# Patient Record
Sex: Female | Born: 1940 | Race: White | Hispanic: No | Smoking: Former smoker
Health system: Southern US, Community
[De-identification: ages and names within clinical notes are randomized; demographics above are authoritative.]

## PROBLEM LIST (undated history)

## (undated) DIAGNOSIS — K219 Gastro-esophageal reflux disease without esophagitis: Secondary | ICD-10-CM

## (undated) DIAGNOSIS — E7800 Pure hypercholesterolemia, unspecified: Secondary | ICD-10-CM

## (undated) DIAGNOSIS — J449 Chronic obstructive pulmonary disease, unspecified: Secondary | ICD-10-CM

## (undated) HISTORY — PX: CHOLECYSTECTOMY: SHX55

## (undated) HISTORY — PX: BREAST LUMPECTOMY: SHX2

## (undated) HISTORY — DX: Chronic obstructive pulmonary disease, unspecified: J44.9

## (undated) HISTORY — PX: GALLBLADDER SURGERY: SHX652

## (undated) HISTORY — DX: Pure hypercholesterolemia, unspecified: E78.00

## (undated) HISTORY — DX: Gastro-esophageal reflux disease without esophagitis: K21.9

---
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
## 1998-10-25 ENCOUNTER — Other Ambulatory Visit: Admission: RE | Admit: 1998-10-25 | Discharge: 1998-10-25 | Payer: Self-pay | Admitting: Gynecology

## 2000-03-19 ENCOUNTER — Other Ambulatory Visit: Admission: RE | Admit: 2000-03-19 | Discharge: 2000-03-19 | Payer: Self-pay | Admitting: Gynecology

## 2001-07-29 ENCOUNTER — Other Ambulatory Visit: Admission: RE | Admit: 2001-07-29 | Discharge: 2001-07-29 | Payer: Self-pay | Admitting: Gynecology

## 2003-04-27 ENCOUNTER — Other Ambulatory Visit: Admission: RE | Admit: 2003-04-27 | Discharge: 2003-04-27 | Payer: Self-pay | Admitting: Gynecology

## 2006-09-26 ENCOUNTER — Encounter: Admission: RE | Admit: 2006-09-26 | Discharge: 2006-09-26 | Payer: Self-pay | Admitting: Internal Medicine

## 2015-10-10 ENCOUNTER — Encounter (INDEPENDENT_AMBULATORY_CARE_PROVIDER_SITE_OTHER): Payer: Self-pay | Admitting: Ophthalmology

## 2015-10-24 ENCOUNTER — Encounter (INDEPENDENT_AMBULATORY_CARE_PROVIDER_SITE_OTHER): Payer: Medicare HMO | Admitting: Ophthalmology

## 2015-10-24 DIAGNOSIS — H3509 Other intraretinal microvascular abnormalities: Secondary | ICD-10-CM | POA: Diagnosis not present

## 2015-10-24 DIAGNOSIS — H35033 Hypertensive retinopathy, bilateral: Secondary | ICD-10-CM | POA: Diagnosis not present

## 2015-10-24 DIAGNOSIS — H4312 Vitreous hemorrhage, left eye: Secondary | ICD-10-CM | POA: Diagnosis not present

## 2015-10-24 DIAGNOSIS — I10 Essential (primary) hypertension: Secondary | ICD-10-CM

## 2015-10-24 DIAGNOSIS — H43813 Vitreous degeneration, bilateral: Secondary | ICD-10-CM

## 2016-02-27 ENCOUNTER — Ambulatory Visit (INDEPENDENT_AMBULATORY_CARE_PROVIDER_SITE_OTHER): Payer: Medicare HMO | Admitting: Ophthalmology

## 2016-02-27 DIAGNOSIS — H3509 Other intraretinal microvascular abnormalities: Secondary | ICD-10-CM

## 2016-02-27 DIAGNOSIS — H35033 Hypertensive retinopathy, bilateral: Secondary | ICD-10-CM | POA: Diagnosis not present

## 2016-02-27 DIAGNOSIS — I10 Essential (primary) hypertension: Secondary | ICD-10-CM | POA: Diagnosis not present

## 2016-02-27 DIAGNOSIS — H43813 Vitreous degeneration, bilateral: Secondary | ICD-10-CM

## 2016-04-28 ENCOUNTER — Encounter (INDEPENDENT_AMBULATORY_CARE_PROVIDER_SITE_OTHER): Payer: Medicare HMO | Admitting: Ophthalmology

## 2016-04-28 DIAGNOSIS — H43813 Vitreous degeneration, bilateral: Secondary | ICD-10-CM

## 2016-04-28 DIAGNOSIS — H3509 Other intraretinal microvascular abnormalities: Secondary | ICD-10-CM

## 2016-04-28 DIAGNOSIS — H35033 Hypertensive retinopathy, bilateral: Secondary | ICD-10-CM | POA: Diagnosis not present

## 2016-04-28 DIAGNOSIS — I10 Essential (primary) hypertension: Secondary | ICD-10-CM

## 2016-09-04 ENCOUNTER — Ambulatory Visit (INDEPENDENT_AMBULATORY_CARE_PROVIDER_SITE_OTHER): Payer: Medicare HMO | Admitting: Ophthalmology

## 2017-11-16 ENCOUNTER — Encounter (INDEPENDENT_AMBULATORY_CARE_PROVIDER_SITE_OTHER): Payer: Medicare HMO | Admitting: Ophthalmology

## 2017-11-16 DIAGNOSIS — H3562 Retinal hemorrhage, left eye: Secondary | ICD-10-CM

## 2017-11-16 DIAGNOSIS — H3509 Other intraretinal microvascular abnormalities: Secondary | ICD-10-CM | POA: Diagnosis not present

## 2017-11-16 DIAGNOSIS — H35022 Exudative retinopathy, left eye: Secondary | ICD-10-CM

## 2017-11-16 DIAGNOSIS — H43813 Vitreous degeneration, bilateral: Secondary | ICD-10-CM

## 2017-11-16 DIAGNOSIS — I10 Essential (primary) hypertension: Secondary | ICD-10-CM | POA: Diagnosis not present

## 2017-11-16 DIAGNOSIS — H35033 Hypertensive retinopathy, bilateral: Secondary | ICD-10-CM | POA: Diagnosis not present

## 2017-11-16 DIAGNOSIS — H26493 Other secondary cataract, bilateral: Secondary | ICD-10-CM | POA: Diagnosis not present

## 2017-11-20 ENCOUNTER — Encounter (INDEPENDENT_AMBULATORY_CARE_PROVIDER_SITE_OTHER): Payer: Medicare HMO | Admitting: Ophthalmology

## 2017-11-20 DIAGNOSIS — H26491 Other secondary cataract, right eye: Secondary | ICD-10-CM | POA: Diagnosis not present

## 2020-03-21 ENCOUNTER — Encounter (INDEPENDENT_AMBULATORY_CARE_PROVIDER_SITE_OTHER): Payer: Medicare HMO | Admitting: Ophthalmology

## 2020-03-21 ENCOUNTER — Other Ambulatory Visit: Payer: Self-pay

## 2020-03-21 DIAGNOSIS — H3509 Other intraretinal microvascular abnormalities: Secondary | ICD-10-CM | POA: Diagnosis not present

## 2020-03-21 DIAGNOSIS — H3560 Retinal hemorrhage, unspecified eye: Secondary | ICD-10-CM | POA: Diagnosis not present

## 2020-03-21 DIAGNOSIS — H35022 Exudative retinopathy, left eye: Secondary | ICD-10-CM

## 2020-03-21 DIAGNOSIS — I10 Essential (primary) hypertension: Secondary | ICD-10-CM

## 2020-03-21 DIAGNOSIS — H4312 Vitreous hemorrhage, left eye: Secondary | ICD-10-CM

## 2020-03-21 DIAGNOSIS — H35033 Hypertensive retinopathy, bilateral: Secondary | ICD-10-CM

## 2020-03-23 ENCOUNTER — Other Ambulatory Visit: Payer: Self-pay

## 2020-03-23 ENCOUNTER — Encounter (INDEPENDENT_AMBULATORY_CARE_PROVIDER_SITE_OTHER): Payer: Medicare HMO | Admitting: Ophthalmology

## 2020-07-16 ENCOUNTER — Other Ambulatory Visit (HOSPITAL_COMMUNITY): Payer: Self-pay | Admitting: Emergency Medicine

## 2020-07-16 ENCOUNTER — Emergency Department (HOSPITAL_BASED_OUTPATIENT_CLINIC_OR_DEPARTMENT_OTHER)
Admission: EM | Admit: 2020-07-16 | Discharge: 2020-07-16 | Disposition: A | Discharge status: Home / Self Care | Payer: Medicare HMO | Attending: Emergency Medicine | Admitting: Emergency Medicine

## 2020-07-16 ENCOUNTER — Other Ambulatory Visit: Payer: Self-pay

## 2020-07-16 ENCOUNTER — Emergency Department (HOSPITAL_BASED_OUTPATIENT_CLINIC_OR_DEPARTMENT_OTHER): Payer: Medicare HMO

## 2020-07-16 DIAGNOSIS — R202 Paresthesia of skin: Secondary | ICD-10-CM

## 2020-07-16 DIAGNOSIS — R252 Cramp and spasm: Secondary | ICD-10-CM | POA: Diagnosis not present

## 2020-07-16 DIAGNOSIS — J449 Chronic obstructive pulmonary disease, unspecified: Secondary | ICD-10-CM | POA: Insufficient documentation

## 2020-07-16 DIAGNOSIS — R0789 Other chest pain: Secondary | ICD-10-CM | POA: Diagnosis not present

## 2020-07-16 LAB — CBC WITH DIFFERENTIAL/PLATELET
Abs Immature Granulocytes: 0.03 10*3/uL (ref 0.00–0.07)
Basophils Absolute: 0.1 10*3/uL (ref 0.0–0.1)
Basophils Relative: 1 %
Eosinophils Absolute: 0.3 10*3/uL (ref 0.0–0.5)
Eosinophils Relative: 3 %
HCT: 44.7 % (ref 36.0–46.0)
Hemoglobin: 15.2 g/dL — ABNORMAL HIGH (ref 12.0–15.0)
Immature Granulocytes: 0 %
Lymphocytes Relative: 19 %
Lymphs Abs: 2 10*3/uL (ref 0.7–4.0)
MCH: 32.9 pg (ref 26.0–34.0)
MCHC: 34 g/dL (ref 30.0–36.0)
MCV: 96.8 fL (ref 80.0–100.0)
Monocytes Absolute: 0.9 10*3/uL (ref 0.1–1.0)
Monocytes Relative: 9 %
Neutro Abs: 7 10*3/uL (ref 1.7–7.7)
Neutrophils Relative %: 68 %
Platelets: 340 10*3/uL (ref 150–400)
RBC: 4.62 MIL/uL (ref 3.87–5.11)
RDW: 12 % (ref 11.5–15.5)
WBC: 10.4 10*3/uL (ref 4.0–10.5)
nRBC: 0 % (ref 0.0–0.2)

## 2020-07-16 LAB — BASIC METABOLIC PANEL
Anion gap: 10 (ref 5–15)
BUN: 11 mg/dL (ref 8–23)
CO2: 26 mmol/L (ref 22–32)
Calcium: 9.3 mg/dL (ref 8.9–10.3)
Chloride: 99 mmol/L (ref 98–111)
Creatinine, Ser: 0.62 mg/dL (ref 0.44–1.00)
GFR, Estimated: >60 mL/min (ref >60)
Glucose, Bld: 118 mg/dL — ABNORMAL HIGH (ref 70–99)
Potassium: 3.8 mmol/L (ref 3.5–5.1)
Sodium: 135 mmol/L (ref 135–145)

## 2020-07-16 LAB — TROPONIN I (HIGH SENSITIVITY): Troponin I (High Sensitivity): 4 ng/L (ref <18)

## 2020-07-16 MED ORDER — SODIUM CHLORIDE 0.9 % IV BOLUS
500.0000 mL | Freq: Once | INTRAVENOUS | Status: AC
  Administered 2020-07-16: 500 mL via INTRAVENOUS

## 2020-07-16 MED ORDER — CYCLOBENZAPRINE HCL 5 MG PO TABS: 5.0000 mg | ORAL_TABLET | Freq: Two times a day (BID) | ORAL | 0 refills | Status: DC | PRN

## 2020-07-16 MED FILL — CYCLOBENZAPRINE HCL 5 MG TA: 5 | 8 days supply | Qty: 15 | Fill #0

## 2020-07-16 NOTE — ED Provider Notes (Signed)
[facility] EMERGENCY DEPARTMENT Provider Note   CSN: [id number] Arrival date & time: 07/16/20  1212     History Chief Complaint  Patient presents with  . Numbness    Numbness and tingling in left arm  . Shortness of Breath    Pt has COPD but reports pressure in her chest and becoming SOB when moving to different rooms in the house.  . Chest Pain    Cynthia Walton is a 80 y.o. female.  Patient arrives with multiple issues.  Most pressing issues patient is having some tingling around her left elbow.  Denies any neck pain, weakness, numbness.  She is having burning sensation to the left mid arm.  She denies any headaches.  She has been having some intermittent shortness of breath but does have COPD and fairly chronic.  Sometimes she will have some chest pressure.  No active chest pain or shortness of breath.  Mostly concerned about the tingling sensation in her left elbow area.  Denies any trauma.  The history is provided by the patient.  Illness Severity:  Mild Onset quality:  Gradual Timing:  Intermittent Progression:  Waxing and waning Chronicity:  New Relieved by:  Nothing Worsened by:  Nothing Associated symptoms: chest pain and shortness of breath   Associated symptoms: no abdominal pain, no cough, no ear pain, no fever, no headaches, no rash, no sore throat and no vomiting        No past medical history on file.  There are no problems to display for this patient.      OB History   No obstetric history on file.     No family history on file.     Home Medications Prior to Admission medications   Medication Sig Start Date End Date Taking? Authorizing Provider  cyclobenzaprine (FLEXERIL) 5 MG tablet Take 1 tablet (5 mg total) by mouth 2 (two) times daily as needed for up to 15 doses for muscle spasms. 07/16/20  Yes Kristinia Leavy, DO    Allergies    Penicillins, Procaine, and Statins  Review of Systems   Review of Systems  Constitutional:  Negative for chills and fever.  HENT: Negative for ear pain and sore throat.   Eyes: Negative for pain and visual disturbance.  Respiratory: Positive for shortness of breath. Negative for cough.   Cardiovascular: Positive for chest pain. Negative for palpitations.  Gastrointestinal: Negative for abdominal pain and vomiting.  Genitourinary: Negative for dysuria and hematuria.  Musculoskeletal: Negative for arthralgias and back pain.  Skin: Negative for color change and rash.  Neurological: Positive for numbness (tingling to left arm). Negative for dizziness, tremors, seizures, syncope, facial asymmetry, speech difficulty, weakness, light-headedness and headaches.  All other systems reviewed and are negative.   Physical Exam Updated Vital Signs  ED Triage Vitals  Enc Vitals Group     BP 07/16/20 1226 117/90     Pulse Rate 07/16/20 1226 94     Resp 07/16/20 1226 18     Temp 07/16/20 1226 97.9 F (36.6 C)     Temp Source 07/16/20 1226 Oral     SpO2 07/16/20 1226 94 %     Weight --      Height --      Head Circumference --      Peak Flow --      Pain Score 07/16/20 1228 0     Pain Loc --      Pain Edu? --  Excl. in GC? --     Physical Exam Vitals and nursing note reviewed.  Constitutional:      General: She is not in acute distress.    Appearance: She is well-developed and well-nourished. She is not ill-appearing.  HENT:     Head: Normocephalic and atraumatic.     Mouth/Throat:     Mouth: Mucous membranes are moist.  Eyes:     Conjunctiva/sclera: Conjunctivae normal.     Pupils: Pupils are equal, round, and reactive to light.  Cardiovascular:     Rate and Rhythm: Normal rate and regular rhythm.     Pulses: Normal pulses.     Heart sounds: Normal heart sounds. No murmur heard.   Pulmonary:     Effort: Pulmonary effort is normal. No respiratory distress.     Breath sounds: Normal breath sounds. No decreased breath sounds or wheezing.  Abdominal:     Palpations:  Abdomen is soft.     Tenderness: There is no abdominal tenderness.  Musculoskeletal:        General: No edema. Normal range of motion.     Cervical back: Normal range of motion and neck supple.     Right lower leg: No edema.     Left lower leg: No tenderness. No edema.  Skin:    General: Skin is warm and dry.     Capillary Refill: Capillary refill takes less than 2 seconds.  Neurological:     General: No focal deficit present.     Mental Status: She is alert and oriented to person, place, and time.     Cranial Nerves: No cranial nerve deficit.     Motor: No weakness.     Comments: 5+ out of 5 strength throughout, normal sensation, no drift, normal finger-to-nose finger, normal speech, normal gait  Psychiatric:        Mood and Affect: Mood and affect and mood normal.     ED Results / Procedures / Treatments   Labs (all labs ordered are listed, but only abnormal results are displayed) Labs Reviewed  CBC WITH DIFFERENTIAL/PLATELET - Abnormal; Notable for the following components:      Result Value   Hemoglobin 15.2 (*)    All other components within normal limits  BASIC METABOLIC PANEL - Abnormal; Notable for the following components:   Glucose, Bld 118 (*)    All other components within normal limits  TROPONIN I (HIGH SENSITIVITY)    EKG EKG Interpretation  Date/Time:  Monday July 16 2020 12:34:25 EST Ventricular Rate:  101 PR Interval:  128 QRS Duration: 78 QT Interval:  332 QTC Calculation: 430 R Axis:   82 Text Interpretation: Sinus tachycardia Otherwise normal ECG Confirmed by Virgina Norfolk [phone]) on 07/16/2020 12:47:54 PM   Radiology CT Head Wo Contrast  Result Date: 07/16/2020 CLINICAL DATA:  Migraine headache. Numbness and tingling left arm. Chest pressure. EXAM: CT HEAD WITHOUT CONTRAST TECHNIQUE: Contiguous axial images were obtained from the base of the skull through the vertex without intravenous contrast. COMPARISON:  None. FINDINGS: Brain: Mild  atrophy.  Negative for hydrocephalus. Moderate white matter changes with hypodensity throughout the cerebral white matter bilaterally extending into the internal capsule on the left. Negative for acute cortical infarct, hemorrhage, mass. Vascular: Negative for hyperdense vessel Skull: Negative Sinuses/Orbits: Paranasal sinuses clear. No orbital lesion. Bilateral cataract extraction. Other: None IMPRESSION: Mild atrophy.  Moderate white matter ischemia likely chronic. No acute abnormality. Electronically Signed   By: Marlan Palau M.D.  On: 07/16/2020 13:43   DG Chest Portable 1 View  Result Date: 07/16/2020 CLINICAL DATA:  Chest pressure for the past 3 days. EXAM: PORTABLE CHEST 1 VIEW COMPARISON:  Chest x-ray dated August 18, 2019. FINDINGS: The heart size and mediastinal contours are within normal limits. 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 No focal consolidation, pleural effusion, or pneumothorax. The visualized skeletal structures are unremarkable. IMPRESSION: 1. COPD without active disease. Electronically Signed   By: Obie Dredge M.D.   On: 07/16/2020 12:44    Procedures Procedures   Medications Ordered in ED Medications  sodium chloride 0.9 % bolus 500 mL (0 mLs Intravenous Stopped 07/16/20 1356)    ED Course  I have reviewed the triage vital signs and the nursing notes.  Pertinent labs & imaging results that were available during my care of the patient were reviewed by me and considered in my medical decision making (see chart for details).    MDM Rules/Calculators/A&P                          Cynthia Walton is a 80 year old female who presents to the ED with multiple complaints.  History of COPD.  Normal vitals.  No fever.  Mostly concerned about some left elbow burning pain for the last several days.  Has a history of bad cramps mostly in her lower legs and abdomen.  Has been having some of those issues as well to.  Has tried multiple muscle relaxants in  the past with minimal help.  Has had some chest pressure but no current chest pain.  Has some chronic shortness of breath.  But no new shortness of breath.  EKG shows sinus rhythm.  Troponin normal.  Doubt cardiac process.  Patient neurologically intact.  Normal strength and sensation specifically to bilateral upper arms.  Normal gait.  No speech changes.  No neck pain or neck tenderness.  Doubt any cervical radiculopathy.  Could be some overuse injury to the left upper arm.  However suspect likely something musculoskeletal.  No concern for stroke.  Head CT was overall unremarkable.  Chest x-ray without any signs of infection.  Electrolytes unremarkable.  No AKI or hypokalemia.  Overall recommend follow-up with primary care doctor and will refer her to neurology as may be a peripheral nerve issue.  Recommend Tylenol Motrin.  Will prescribe Flexeril.  Discharged in good condition.  Understands return precautions.  This chart was dictated using voice recognition software.  Despite best efforts to proofread,  errors can occur which can change the documentation meaning.    Final Clinical Impression(s) / ED Diagnoses Final diagnoses:  Paresthesia  Leg cramps    Rx / DC Orders ED Discharge Orders         Ordered    cyclobenzaprine (FLEXERIL) 5 MG tablet  2 times daily PRN        07/16/20 1422           Virgina Norfolk, DO 07/16/20 1426

## 2020-07-16 NOTE — ED Triage Notes (Signed)
Pt Reports numbness and tingling in her left arm and a pressure in her chest. Pt denies pain and it states it more a pressure associated with breathing than pain. Pt has some mild  nausea but no vomiting. Pt does have COPD. Pt has been experiencing symptoms for the Past 3 days.

## 2020-07-16 NOTE — ED Notes (Signed)
Numbness to left arm.  Denies pain

## 2020-09-18 ENCOUNTER — Encounter: Payer: Self-pay | Admitting: Diagnostic Neuroimaging

## 2020-09-18 ENCOUNTER — Ambulatory Visit: Payer: Medicare HMO | Admitting: Diagnostic Neuroimaging

## 2020-09-18 VITALS — BP 137/77 | HR 107 | Ht 59.0 in | Wt 123.6 lb

## 2020-09-18 DIAGNOSIS — M62838 Other muscle spasm: Secondary | ICD-10-CM

## 2020-09-18 DIAGNOSIS — M6281 Muscle weakness (generalized): Secondary | ICD-10-CM | POA: Diagnosis not present

## 2020-09-18 NOTE — Patient Instructions (Signed)
INTERMITTENT MUSCLE SPASMS / PROGRESSIVE MUSCLE WEAKNESS - check labs and EMG/NCS

## 2020-09-18 NOTE — Progress Notes (Signed)
[facility]  PATIENT: Cynthia Walton DOB: October 30, 1940  REFERRING CLINICIAN: Herma Carson, MD HISTORY FROM: patient  REASON FOR VISIT: new consult    HISTORICAL  CHIEF COMPLAINT:  Chief Complaint  Patient presents with  . Paresthesias    Rm 6 New Pt, ED referral  "still having numbness/tingling in left arm"    HISTORY OF PRESENT ILLNESS:   80 year old female here for evaluation of muscle spasms.  Patient has had diffuse aches and pains since 2016, previously diagnosed with fibromyalgia.  In 2019 she started having muscle spasms in her arms, legs, ribs and chest.  Symptoms continued over time.  In the past 1 year she has had muscle weakness in her legs, difficulty with climbing stairs.  In February 2022 patient went to the hospital for evaluation of shortness of breath, chest pain and numbness and tingling in left arm.  No specific cause are found.     REVIEW OF SYSTEMS: Full 14 system review of systems performed and negative with exception of: As per HPI.  ALLERGIES: Allergies  Allergen Reactions  . Penicillins Hives, Rash and Swelling  . Procaine Other (See Comments)    Mouth decay    . Statins Other (See Comments)    Muscle aches Muscle aches     HOME MEDICATIONS: Outpatient Medications Prior to Visit  Medication Sig Dispense Refill  . ezetimibe (ZETIA) 10 MG tablet Take 10 mg by mouth at bedtime.    . Multiple Minerals (CALCIUM-MAGNESIUM-ZINC) TABS Take by mouth.    Marland Kitchen omeprazole (PRILOSEC) 20 MG capsule Take 1 capsule by mouth daily.    Marland Kitchen STIOLTO RESPIMAT 2.5-2.5 MCG/ACT AERS Take 1 puff by mouth daily.    Marland Kitchen VITAMIN E PO Take by mouth.    . cyclobenzaprine (FLEXERIL) 5 MG tablet Take 1 tablet (5 mg total) by mouth 2 (two) times daily as needed for up to 15 doses for muscle spasms. (Patient not taking: Reported on 09/18/2020) 15 tablet 0  . cyclobenzaprine (FLEXERIL) 5 MG tablet TAKE 1 TABLET BY MOUTH 2 TIMES DAILY AS NEEDED FOR MUSCLE  SPASMS (Patient not taking: Reported on 09/18/2020) 15 tablet 0   No facility-administered medications prior to visit.    PAST MEDICAL HISTORY: Past Medical History:  Diagnosis Date  . Acid reflux   . COPD (chronic obstructive pulmonary disease) (HCC)   . Hypercholesterolemia     PAST SURGICAL HISTORY: Past Surgical History:  Procedure Laterality Date  . APPENDECTOMY  1962   ruptured ovarian cyst  . BREAST LUMPECTOMY    . CHOLECYSTECTOMY    . GALLBLADDER SURGERY     removed stones    FAMILY HISTORY: Family History  Problem Relation Age of Onset  . Heart disease Mother   . Other Father        old age    SOCIAL HISTORY: Social History   Socioeconomic History  . Marital status: Widowed    Spouse name: Not on file  . Number of children: 1  . Years of education: Not on file  . Highest education level: High school graduate  Occupational History    Comment: retired  Tobacco Use  . Smoking status: Former Games developer  . Smokeless tobacco: Never Used  . Tobacco comment: quit many years ago  Substance and Sexual Activity  . Alcohol use: Not Currently  . Drug use: Never  . Sexual activity: Not on file  Other Topics Concern  . Not on file  Social History Narrative   Lives  alone   Social Determinants of Health   Financial Resource Strain: Not on file  Food Insecurity: Not on file  Transportation Needs: Not on file  Physical Activity: Not on file  Stress: Not on file  Social Connections: Not on file  Intimate Partner Violence: Not on file     PHYSICAL EXAM  GENERAL EXAM/CONSTITUTIONAL: Vitals:  Vitals:   09/18/20 1325  BP: 137/77  Pulse: (!) 107  Weight: 123 lb 9.6 oz (56.1 kg)  Height: 4\' 11"  (1.499 m)   Body mass index is 24.96 kg/m. Wt Readings from Last 3 Encounters:  09/18/20 123 lb 9.6 oz (56.1 kg)    Patient is in no distress; well developed, nourished and groomed; neck is supple  CARDIOVASCULAR:  Examination of carotid arteries is normal; no  carotid bruits  Regular rate and rhythm, no murmurs  Examination of peripheral vascular system by observation and palpation is normal  EYES:  Ophthalmoscopic exam of optic discs and posterior segments is normal; no papilledema or hemorrhages No exam data present  MUSCULOSKELETAL:  Gait, strength, tone, movements noted in Neurologic exam below  NEUROLOGIC: MENTAL STATUS:  No flowsheet data found.  awake, alert, oriented to person, place and time  recent and remote memory intact  normal attention and concentration  language fluent, comprehension intact, naming intact  fund of knowledge appropriate  CRANIAL NERVE:   2nd - no papilledema on fundoscopic exam  2nd, 3rd, 4th, 6th - pupils equal and reactive to light, visual fields full to confrontation, extraocular muscles intact, no nystagmus  5th - facial sensation symmetric  7th - facial strength symmetric  8th - hearing intact  9th - palate elevates symmetrically, uvula midline  11th - shoulder shrug symmetric  12th - tongue protrusion midline  MOTOR:   normal bulk and tone, full strength in the BUE  BLE --> HIP FLEXION 3, KE/KF 4, DF 4+  SENSORY:   normal and symmetric to light touch, temperature, vibration  COORDINATION:   finger-nose-finger, fine finger movements normal  REFLEXES:   deep tendon reflexes 1+ and symmetric  GAIT/STATION:   narrow based gait; cautious waddling gait     DIAGNOSTIC DATA (LABS, IMAGING, TESTING) - I reviewed patient records, labs, notes, testing and imaging myself where available.  Lab Results  Component Value Date   WBC 10.4 07/16/2020   HGB 15.2 (H) 07/16/2020   HCT 44.7 07/16/2020   MCV 96.8 07/16/2020   PLT 340 07/16/2020      Component Value Date/Time   NA 135 07/16/2020 1313   K 3.8 07/16/2020 1313   CL 99 07/16/2020 1313   CO2 26 07/16/2020 1313   GLUCOSE 118 (H) 07/16/2020 1313   BUN 11 07/16/2020 1313   CREATININE 0.62 07/16/2020 1313    CALCIUM 9.3 07/16/2020 1313   GFRNONAA >60 07/16/2020 1313   No results found for: CHOL, HDL, LDLCALC, LDLDIRECT, TRIG, CHOLHDL No results found for: 07/18/2020 No results found for: VITAMINB12 No results found for: TSH  04/26/20    Ref Range & Units 4 mo ago  HEMOGLOBIN A1C <5.7 % 6.2 High        ASSESSMENT AND PLAN  80 y.o. year old female here with diffuse muscle pain, spasms, weakness progressing since 2016.  We will proceed with further work-up to rule out neuromuscular diseases, myopathies or neuropathies  Dx:  1. Muscle spasm   2. Muscle weakness      PLAN:  INTERMITTENT MUSCLE SPASMS / PROGRESSIVE MUSCLE WEAKNESS (ddx: myopathy  vs motor neuropathy) - check labs and EMG/NCS  Orders Placed This Encounter  Procedures  . CK  . Aldolase  . Vitamin B12  . TSH  . ANA,IFA RA Diag Pnl w/rflx Tit/Patn  . Angiotensin converting enzyme  . Pan-ANCA  . NCV with EMG(electromyography)   Return for for NCV/EMG.    Suanne Marker, MD 09/18/2020, 2:05 PM Certified in Neurology, Neurophysiology and Neuroimaging  [facility] [address], [address] [place], [place] [address] [phone]

## 2020-09-20 LAB — PAN-ANCA
ANCA Proteinase 3: <3.5 U/mL (ref 0.0–3.5)
Atypical pANCA: <1:20 {titer}
C-ANCA: <1:20 {titer}
Myeloperoxidase Ab: <9 U/mL (ref 0.0–9.0)
P-ANCA: <1:20 {titer}

## 2020-09-20 LAB — VITAMIN B12: Vitamin B-12: 802 pg/mL (ref 232–1245)

## 2020-09-20 LAB — TSH: TSH: 1.81 u[IU]/mL (ref 0.450–4.500)

## 2020-09-20 LAB — ANA,IFA RA DIAG PNL W/RFLX TIT/PATN
ANA Titer 1: NEGATIVE
Cyclic Citrullin Peptide Ab: 5 units (ref 0–19)
Rheumatoid fact SerPl-aCnc: <10 IU/mL (ref <14.0)

## 2020-09-20 LAB — ANGIOTENSIN CONVERTING ENZYME: Angio Convert Enzyme: 43 U/L (ref 14–82)

## 2020-09-20 LAB — ALDOLASE: Aldolase: 11.2 U/L — ABNORMAL HIGH (ref 3.3–10.3)

## 2020-09-20 LAB — CK: Total CK: 158 U/L (ref 32–182)

## 2020-09-27 ENCOUNTER — Encounter: Payer: Medicare HMO | Admitting: Diagnostic Neuroimaging

## 2020-09-27 ENCOUNTER — Ambulatory Visit (INDEPENDENT_AMBULATORY_CARE_PROVIDER_SITE_OTHER): Payer: Medicare HMO | Admitting: Diagnostic Neuroimaging

## 2020-09-27 DIAGNOSIS — M62838 Other muscle spasm: Secondary | ICD-10-CM

## 2020-09-27 DIAGNOSIS — M6281 Muscle weakness (generalized): Secondary | ICD-10-CM

## 2020-09-27 DIAGNOSIS — Z0289 Encounter for other administrative examinations: Secondary | ICD-10-CM

## 2020-09-27 NOTE — Procedures (Signed)
[facility]  NCS (NERVE CONDUCTION STUDY) WITH EMG (ELECTROMYOGRAPHY) REPORT   STUDY DATE: 09/27/20 PATIENT NAME: Cynthia Walton DOB: [birth year] MRN: [id number]  ORDERING CLINICIAN: Joycelyn Schmid, MD   TECHNOLOGIST: Durenda Age ELECTROMYOGRAPHER: Glenford Bayley. Liela Rylee, MD  CLINICAL INFORMATION: 80 year old female with upper and lower extremity weakness and pain.  FINDINGS: NERVE CONDUCTION STUDY:  Bilateral median, left ulnar, left peroneal and left tibial motor responses are normal.  Bilateral median sensory responses have slightly decreased amplitudes and prolonged peak latencies.  Left sural, left superficial peroneal sensory responses were normal.   NEEDLE ELECTROMYOGRAPHY:  Needle examination of left lower extremity is normal.   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 -No underlying evidence of large fiber neuropathy or myopathy this time.    INTERPRETING PHYSICIAN:  Suanne Marker, MD Certified in Neurology, Neurophysiology and Neuroimaging  [facility] [address] [place], [place] [address] [phone]   [facility]    Nerve / Sites Muscle Latency Ref. Amplitude Ref. Rel Amp Segments Distance Velocity Ref. Area    ms ms mV mV %  cm m/s m/s mVms  L Median - APB     Wrist APB 4.0 ?4.4 6.0 ?4.0 100 Wrist - APB 7   21.9     Upper arm APB 7.2  5.9  98.2 Upper arm - Wrist 18 56 ?49 21.4  R Median - APB     Wrist APB 4.1 ?4.4 6.0 ?4.0 100 Wrist - APB 7   20.2     Upper arm APB 7.6  5.8  96.5 Upper arm - Wrist 19 55 ?49 19.8  L Ulnar - ADM     Wrist ADM 3.1 ?3.3 10.4 ?6.0 100 Wrist - ADM 7   27.9     B.Elbow ADM 5.7  9.6  92.3 B.Elbow - Wrist 16 61 ?49 26.0     A.Elbow ADM 7.4  10.1  105 A.Elbow - B.Elbow 10 60 ?49 27.2  L Peroneal - EDB     Ankle EDB 4.3 ?6.5 2.6 ?2.0 100 Ankle - EDB 9   8.8      Fib head EDB 9.3  2.4  93.3 Fib head - Ankle 23 46 ?44 8.2     Pop fossa EDB 11.5  2.3  95.2 Pop fossa - Fib head 10 45 ?44 7.9         Pop fossa - Ankle      L Tibial - AH     Ankle AH 3.3 ?5.8 5.3 ?4.0 100 Ankle - AH 9   11.7     Pop fossa AH 10.7  4.4  83 Pop fossa - Ankle 32 43 ?41 11.1                SNC    Nerve / Sites Rec. Site Peak Lat Ref.  Amp Ref. Segments Distance    ms ms V V  cm  L Sural - Ankle (Calf)     Calf Ankle 3.4 ?4.4 8 ?6 Calf - Ankle 14  L Superficial peroneal - Ankle     Lat leg Ankle 3.6 ?4.4 6 ?6 Lat leg - Ankle 14  L Median - Orthodromic (Dig II, Mid palm)     Dig II Wrist 4.0 ?3.4 8 ?10 Dig II - Wrist 13  R Median - Orthodromic (Dig II, Mid palm)  Dig II Wrist 4.0 ?3.4 9 ?10 Dig II - Wrist 13  L Ulnar - Orthodromic, (Dig V, Mid palm)     Dig V Wrist 2.9 ?3.1 6 ?5 Dig V - Wrist 54               F  Wave    Nerve F Lat Ref.   ms ms  L Tibial - AH 48.6 ?56.0  L Ulnar - ADM 25.8 ?32.0         EMG Summary Table    Spontaneous MUAP Recruitment  Muscle IA Fib PSW Fasc Other Amp Dur. Poly Pattern  L. Vastus medialis Normal None None None _______ Normal Normal Normal Normal  L. Tibialis anterior Normal None None None _______ Normal Normal Normal Normal  L. Gastrocnemius (Medial head) Normal None None None _______ Normal Normal Normal Normal

## 2020-12-18 LAB — COLOGUARD: COLOGUARD: NEGATIVE

## 2021-12-20 IMAGING — CT CT HEAD W/O CM
3 series · 16 of 47 positions shown, 19 images · non-contrast
Comparison: None.

CLINICAL DATA: Migraine headache. Numbness and tingling left arm.
Chest pressure.

EXAM:
CT HEAD WITHOUT CONTRAST
TECHNIQUE: Contiguous axial images were obtained from the base of the skull
through the vertex without intravenous contrast.

[Series 2: head wo · axial · 0.40mm/px · z∈[-162,-37]mm · 10 of 30 slices shown, 13 images]
[im 3/30  brain]
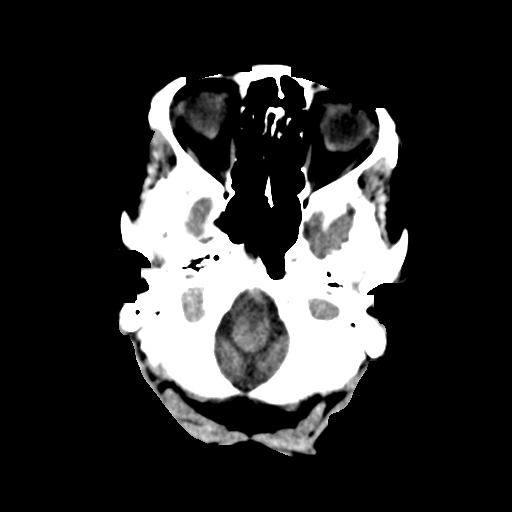
[im 3/30  bone]
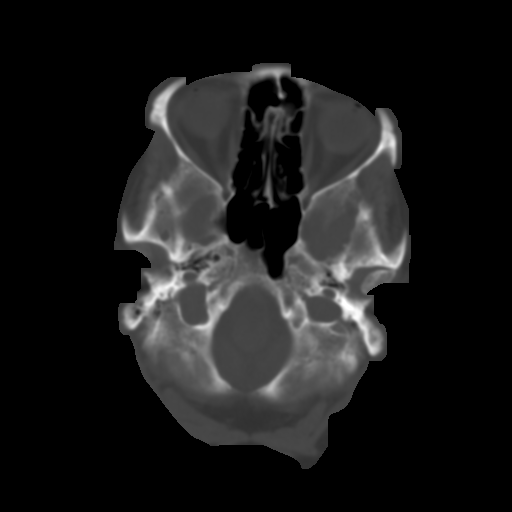
[im 6/30  brain]
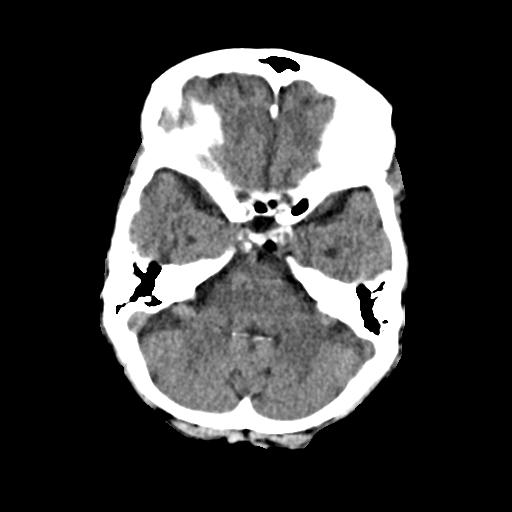
[im 9/30  brain]
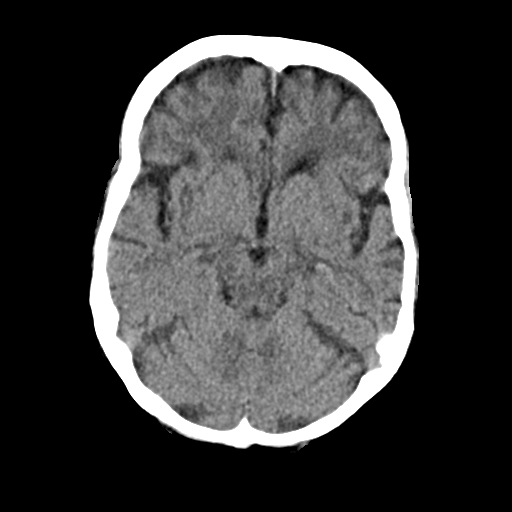
[im 11/30  brain]
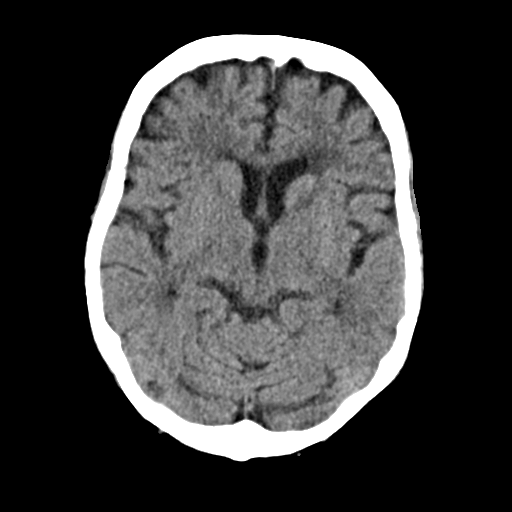
[im 14/30  brain]
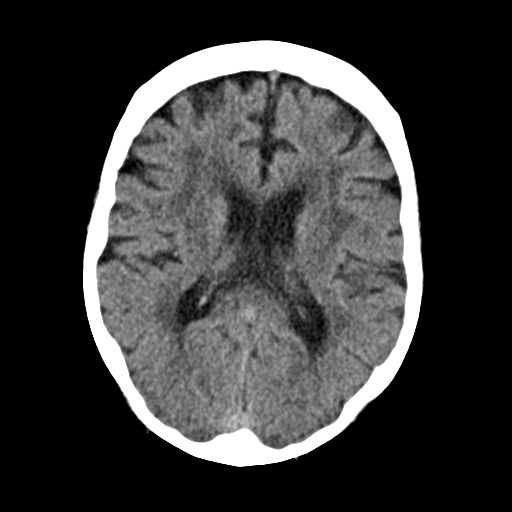
[im 14/30  bone]
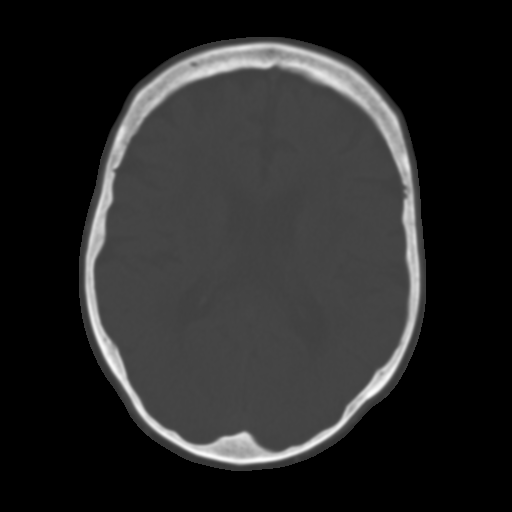
[im 17/30  brain]
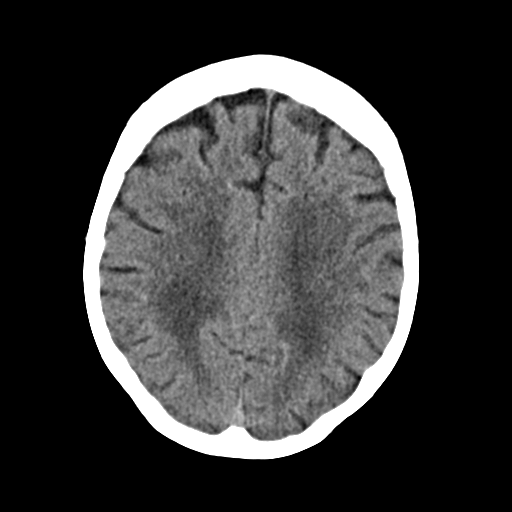
[im 20/30  brain]
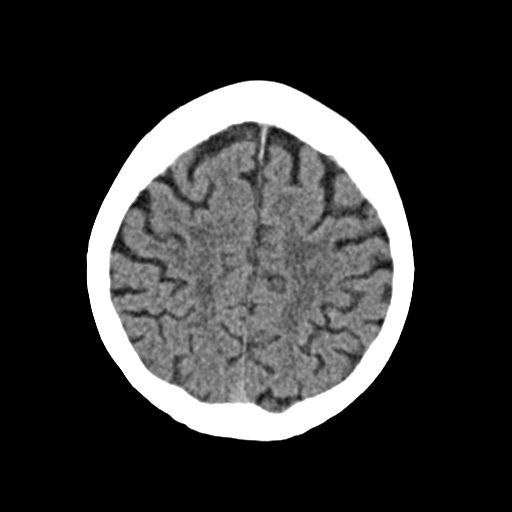
[im 23/30  brain]
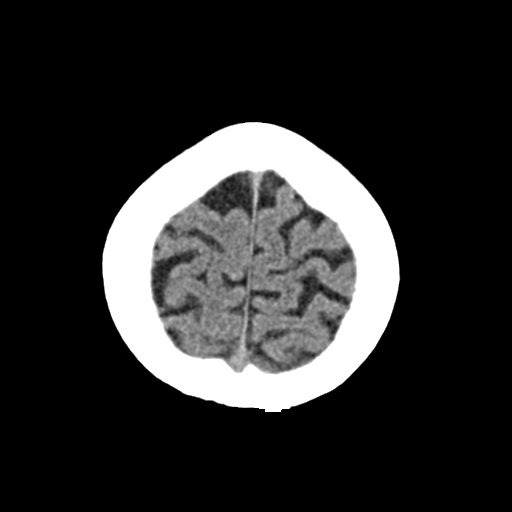
[im 25/30  brain]
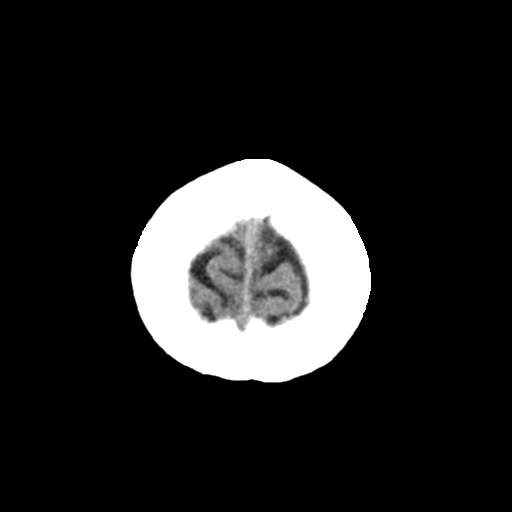
[im 25/30  bone]
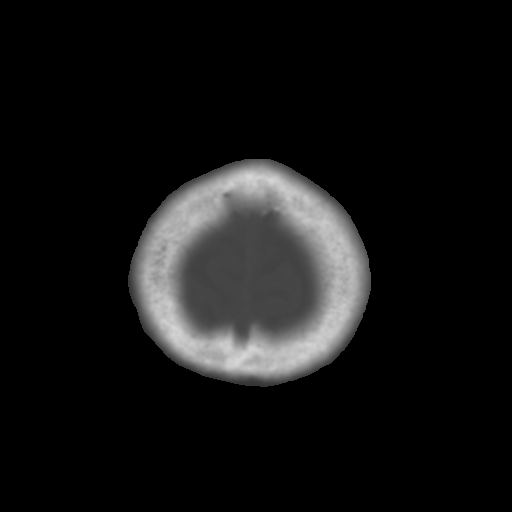
[im 28/30  brain]
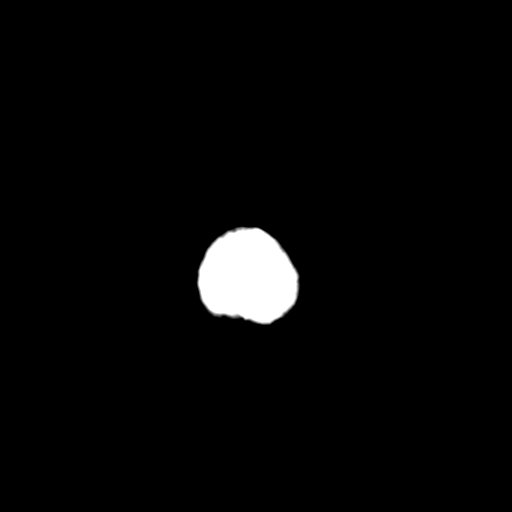

[Series 4: coronal soft · coronal · 0.30mm/px · 3 of 60 slices shown]
[im 20/60  brain]
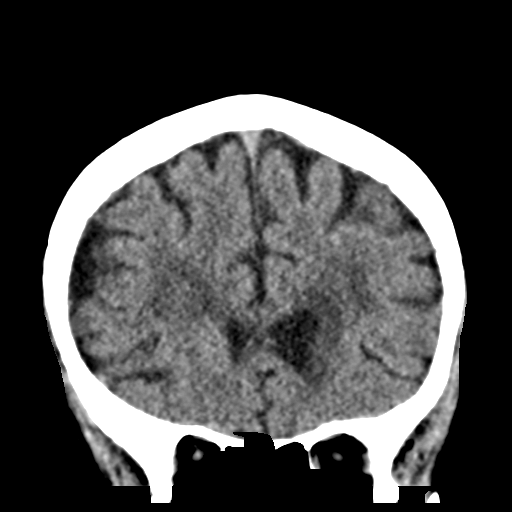
[im 27/60  brain]
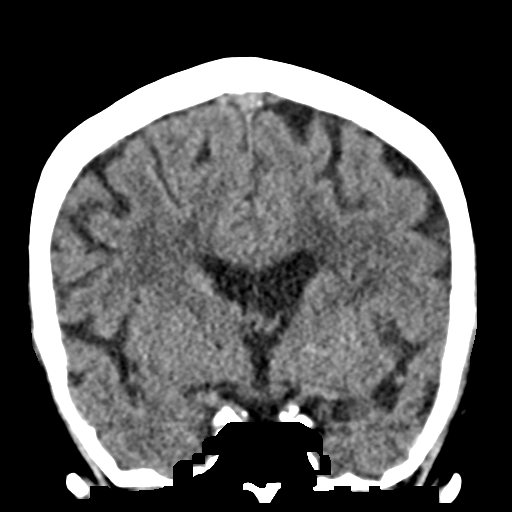
[im 33/60  brain]
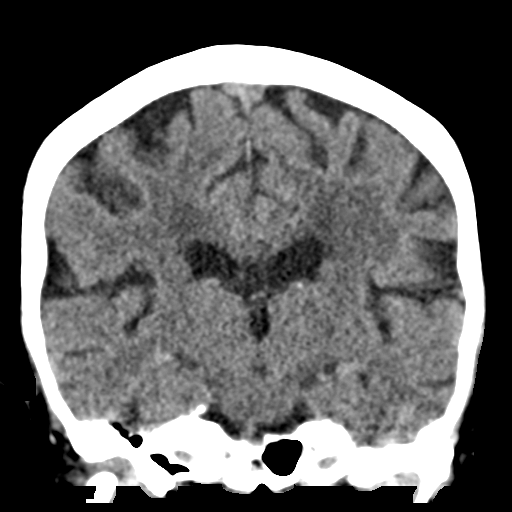

[Series 5: sag soft · sagittal · 0.30mm/px · 3 of 51 slices shown]
[im 17/51  brain]
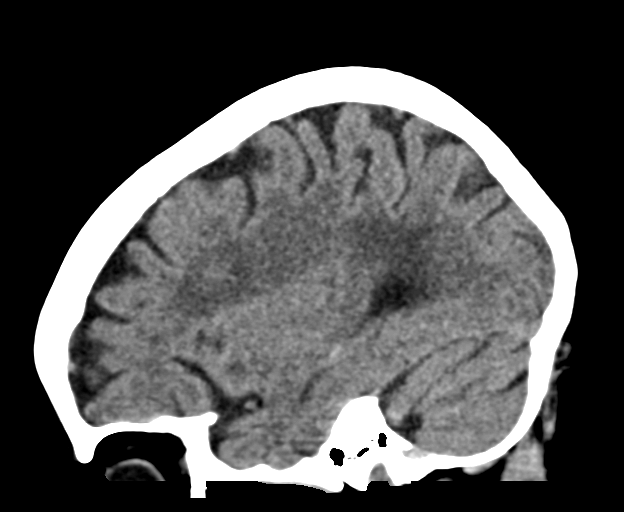
[im 26/51  brain]
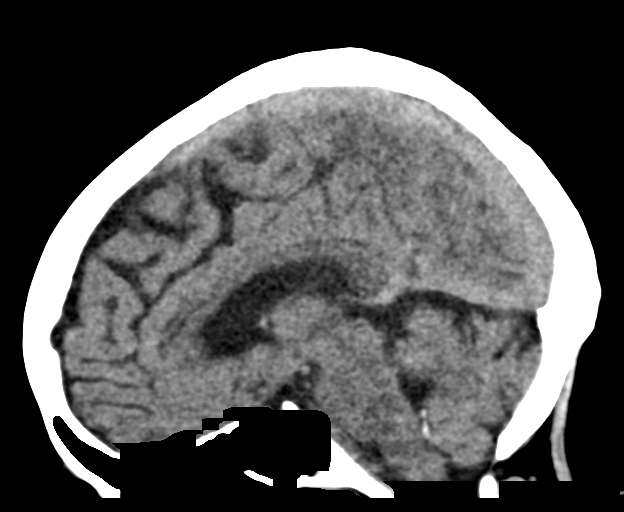
[im 34/51  brain]
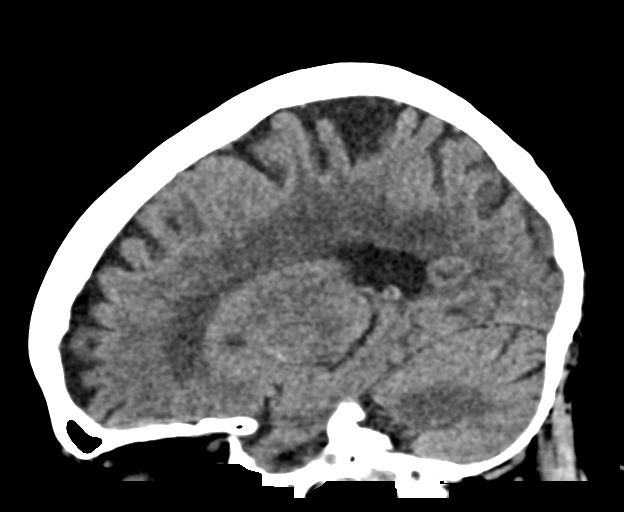

[16 of 47 positions shown; findings below may reference images not displayed]

FINDINGS: Brain: Mild atrophy.  Negative for hydrocephalus.

Moderate white matter changes with hypodensity throughout the
cerebral white matter bilaterally extending into the internal
capsule on the left.

Negative for acute cortical infarct, hemorrhage, mass.

Vascular: Negative for hyperdense vessel

Skull: Negative

Sinuses/Orbits: Paranasal sinuses clear. No orbital lesion.
Bilateral cataract extraction.

Other: None
IMPRESSION: Mild atrophy.  Moderate white matter ischemia likely chronic.

No acute abnormality.

## 2024-06-06 ENCOUNTER — Other Ambulatory Visit: Payer: Self-pay

## 2024-06-06 ENCOUNTER — Encounter (HOSPITAL_BASED_OUTPATIENT_CLINIC_OR_DEPARTMENT_OTHER): Payer: Self-pay

## 2024-06-06 ENCOUNTER — Emergency Department (HOSPITAL_BASED_OUTPATIENT_CLINIC_OR_DEPARTMENT_OTHER)

## 2024-06-06 ENCOUNTER — Inpatient Hospital Stay (HOSPITAL_BASED_OUTPATIENT_CLINIC_OR_DEPARTMENT_OTHER)
Admission: EM | Admit: 2024-06-06 | Discharge: 2024-06-10 | DRG: 180 | Disposition: A | Discharge status: Home / Self Care | Attending: Internal Medicine | Admitting: Internal Medicine

## 2024-06-06 DIAGNOSIS — Z538 Procedure and treatment not carried out for other reasons: Secondary | ICD-10-CM | POA: Diagnosis not present

## 2024-06-06 DIAGNOSIS — Z79899 Other long term (current) drug therapy: Secondary | ICD-10-CM

## 2024-06-06 DIAGNOSIS — Z681 Body mass index (BMI) 19 or less, adult: Secondary | ICD-10-CM

## 2024-06-06 DIAGNOSIS — J9601 Acute respiratory failure with hypoxia: Principal | ICD-10-CM | POA: Diagnosis present

## 2024-06-06 DIAGNOSIS — Z888 Allergy status to other drugs, medicaments and biological substances status: Secondary | ICD-10-CM

## 2024-06-06 DIAGNOSIS — R918 Other nonspecific abnormal finding of lung field: Principal | ICD-10-CM | POA: Diagnosis present

## 2024-06-06 DIAGNOSIS — C799 Secondary malignant neoplasm of unspecified site: Secondary | ICD-10-CM | POA: Diagnosis present

## 2024-06-06 DIAGNOSIS — J441 Chronic obstructive pulmonary disease with (acute) exacerbation: Secondary | ICD-10-CM | POA: Diagnosis present

## 2024-06-06 DIAGNOSIS — R9431 Abnormal electrocardiogram [ECG] [EKG]: Secondary | ICD-10-CM | POA: Diagnosis present

## 2024-06-06 DIAGNOSIS — K219 Gastro-esophageal reflux disease without esophagitis: Secondary | ICD-10-CM | POA: Diagnosis present

## 2024-06-06 DIAGNOSIS — I700 Atherosclerosis of aorta: Secondary | ICD-10-CM | POA: Diagnosis present

## 2024-06-06 DIAGNOSIS — R079 Chest pain, unspecified: Secondary | ICD-10-CM | POA: Diagnosis present

## 2024-06-06 DIAGNOSIS — Z66 Do not resuscitate: Secondary | ICD-10-CM | POA: Diagnosis present

## 2024-06-06 DIAGNOSIS — Z7952 Long term (current) use of systemic steroids: Secondary | ICD-10-CM

## 2024-06-06 DIAGNOSIS — R590 Localized enlarged lymph nodes: Secondary | ICD-10-CM | POA: Diagnosis present

## 2024-06-06 DIAGNOSIS — Z8249 Family history of ischemic heart disease and other diseases of the circulatory system: Secondary | ICD-10-CM

## 2024-06-06 DIAGNOSIS — Z7951 Long term (current) use of inhaled steroids: Secondary | ICD-10-CM

## 2024-06-06 DIAGNOSIS — I452 Bifascicular block: Secondary | ICD-10-CM | POA: Diagnosis present

## 2024-06-06 DIAGNOSIS — Z880 Allergy status to penicillin: Secondary | ICD-10-CM

## 2024-06-06 DIAGNOSIS — K7689 Other specified diseases of liver: Secondary | ICD-10-CM | POA: Diagnosis present

## 2024-06-06 DIAGNOSIS — J439 Emphysema, unspecified: Secondary | ICD-10-CM | POA: Diagnosis present

## 2024-06-06 DIAGNOSIS — G4700 Insomnia, unspecified: Secondary | ICD-10-CM | POA: Diagnosis present

## 2024-06-06 DIAGNOSIS — R634 Abnormal weight loss: Secondary | ICD-10-CM | POA: Diagnosis present

## 2024-06-06 DIAGNOSIS — E871 Hypo-osmolality and hyponatremia: Secondary | ICD-10-CM | POA: Diagnosis present

## 2024-06-06 DIAGNOSIS — G3184 Mild cognitive impairment, so stated: Secondary | ICD-10-CM | POA: Diagnosis present

## 2024-06-06 DIAGNOSIS — Z1152 Encounter for screening for COVID-19: Secondary | ICD-10-CM

## 2024-06-06 DIAGNOSIS — C3411 Malignant neoplasm of upper lobe, right bronchus or lung: Principal | ICD-10-CM | POA: Diagnosis present

## 2024-06-06 DIAGNOSIS — D72829 Elevated white blood cell count, unspecified: Secondary | ICD-10-CM | POA: Diagnosis present

## 2024-06-06 DIAGNOSIS — J449 Chronic obstructive pulmonary disease, unspecified: Secondary | ICD-10-CM | POA: Diagnosis present

## 2024-06-06 DIAGNOSIS — Z87891 Personal history of nicotine dependence: Secondary | ICD-10-CM

## 2024-06-06 DIAGNOSIS — E785 Hyperlipidemia, unspecified: Secondary | ICD-10-CM | POA: Diagnosis present

## 2024-06-06 DIAGNOSIS — C7989 Secondary malignant neoplasm of other specified sites: Secondary | ICD-10-CM

## 2024-06-06 DIAGNOSIS — Z9049 Acquired absence of other specified parts of digestive tract: Secondary | ICD-10-CM

## 2024-06-06 DIAGNOSIS — E7800 Pure hypercholesterolemia, unspecified: Secondary | ICD-10-CM | POA: Diagnosis present

## 2024-06-06 LAB — CBC
HCT: 39.9 % (ref 36.0–46.0)
Hemoglobin: 13.7 g/dL (ref 12.0–15.0)
MCH: 32.2 pg (ref 26.0–34.0)
MCHC: 34.3 g/dL (ref 30.0–36.0)
MCV: 93.9 fL (ref 80.0–100.0)
Platelets: 458 K/uL — ABNORMAL HIGH (ref 150–400)
RBC: 4.25 MIL/uL (ref 3.87–5.11)
RDW: 12.3 % (ref 11.5–15.5)
WBC: 9.4 K/uL (ref 4.0–10.5)
nRBC: 0 % (ref 0.0–0.2)

## 2024-06-06 LAB — TROPONIN T, HIGH SENSITIVITY: Troponin T High Sensitivity: <15 ng/L (ref 0–19)

## 2024-06-06 LAB — BASIC METABOLIC PANEL WITH GFR
Anion gap: 12 (ref 5–15)
BUN: 9 mg/dL (ref 8–23)
CO2: 24 mmol/L (ref 22–32)
Calcium: 10.2 mg/dL (ref 8.9–10.3)
Chloride: 97 mmol/L — ABNORMAL LOW (ref 98–111)
Creatinine, Ser: 0.8 mg/dL (ref 0.44–1.00)
GFR, Estimated: >60 mL/min
Glucose, Bld: 110 mg/dL — ABNORMAL HIGH (ref 70–99)
Potassium: 4.3 mmol/L (ref 3.5–5.1)
Sodium: 132 mmol/L — ABNORMAL LOW (ref 135–145)

## 2024-06-06 MED ORDER — MELATONIN 3 MG PO TABS
3.0000 mg | ORAL_TABLET | Freq: Every day | ORAL | Status: DC
  Administered 2024-06-07 – 2024-06-09 (×4): 3 mg via ORAL
  Filled 2024-06-06 (×4): qty 1

## 2024-06-06 MED ORDER — ALBUTEROL SULFATE HFA 108 (90 BASE) MCG/ACT IN AERS: 2.0000 | INHALATION_SPRAY | RESPIRATORY_TRACT | 0 refills | Status: DC | PRN

## 2024-06-06 MED ORDER — PANTOPRAZOLE SODIUM 40 MG PO TBEC
40.0000 mg | DELAYED_RELEASE_TABLET | Freq: Every day | ORAL | Status: DC
  Administered 2024-06-07 – 2024-06-10 (×4): 40 mg via ORAL
  Filled 2024-06-06 (×4): qty 1

## 2024-06-06 MED ORDER — LORAZEPAM 1 MG PO TABS: 0.5000 mg | ORAL_TABLET | ORAL | Status: DC | PRN

## 2024-06-06 MED ORDER — ALBUTEROL SULFATE HFA 108 (90 BASE) MCG/ACT IN AERS: 1.0000 | INHALATION_SPRAY | Freq: Four times a day (QID) | RESPIRATORY_TRACT | Status: DC | PRN

## 2024-06-06 MED ORDER — PREDNISONE 50 MG PO TABS: ORAL_TABLET | ORAL | 0 refills | Status: DC

## 2024-06-06 MED ORDER — MORPHINE SULFATE (PF) 2 MG/ML IV SOLN
2.0000 mg | Freq: Once | INTRAVENOUS | Status: AC
  Administered 2024-06-06: 2 mg via INTRAVENOUS
  Filled 2024-06-06: qty 1

## 2024-06-06 MED ORDER — FENTANYL CITRATE (PF) 50 MCG/ML IJ SOSY: 50.0000 ug | PREFILLED_SYRINGE | INTRAMUSCULAR | Status: DC | PRN

## 2024-06-06 MED ORDER — IOHEXOL 350 MG/ML SOLN
100.0000 mL | Freq: Once | INTRAVENOUS | Status: AC | PRN
  Administered 2024-06-06: 75 mL via INTRAVENOUS

## 2024-06-06 MED ORDER — FENOFIBRATE 160 MG PO TABS
160.0000 mg | ORAL_TABLET | Freq: Every day | ORAL | Status: DC
  Administered 2024-06-08 – 2024-06-10 (×3): 160 mg via ORAL
  Filled 2024-06-06 (×3): qty 1

## 2024-06-06 MED ORDER — MAGNESIUM OXIDE -MG SUPPLEMENT 400 (240 MG) MG PO TABS: 400.0000 mg | ORAL_TABLET | Freq: Every day | ORAL | Status: DC

## 2024-06-06 MED ORDER — ONDANSETRON HCL 4 MG/2ML IJ SOLN
4.0000 mg | Freq: Once | INTRAMUSCULAR | Status: AC
  Administered 2024-06-06: 4 mg via INTRAVENOUS
  Filled 2024-06-06: qty 2

## 2024-06-06 MED ORDER — METHYLPREDNISOLONE SODIUM SUCC 125 MG IJ SOLR
125.0000 mg | Freq: Once | INTRAMUSCULAR | Status: AC
  Administered 2024-06-06: 125 mg via INTRAVENOUS
  Filled 2024-06-06: qty 2

## 2024-06-06 MED ORDER — BUDESON-GLYCOPYRROL-FORMOTEROL 160-9-4.8 MCG/ACT IN AERO
2.0000 | INHALATION_SPRAY | Freq: Two times a day (BID) | RESPIRATORY_TRACT | Status: DC
  Administered 2024-06-08 – 2024-06-10 (×5): 2 via RESPIRATORY_TRACT
  Filled 2024-06-06: qty 5.9

## 2024-06-06 MED ORDER — IPRATROPIUM-ALBUTEROL 0.5-2.5 (3) MG/3ML IN SOLN
3.0000 mL | Freq: Once | RESPIRATORY_TRACT | Status: AC
  Administered 2024-06-06: 3 mL via RESPIRATORY_TRACT
  Filled 2024-06-06: qty 3

## 2024-06-06 NOTE — ED Provider Notes (Signed)
 " [place] [facility]ERGENCY DEPARTMENT AT [facility] Provider Note   CSN: [id number] Arrival date & time: 06/06/24  9070     Patient presents with: Chest Pain   Cynthia Walton is a 84 y.o. female.   84 year old female with past medical history of COPD and hyperlipidemia presents the emergency department today with chest pain.  The patient states she is having pain over her whole chest that radiates to her back.  States that this started 2 weeks ago and is gradually worsening.  States the pain is sharp in character.  Denies any leg pain or swelling.  Denies any hemoptysis.  States she has not really been coughing.  Reports that she is not really short of breath but that deep breathing does make the pain worse.  States that is also somewhat worse with movement.  She came to the emergency department today due to ongoing symptoms.   Chest Pain      Prior to Admission medications  Medication Sig Start Date End Date Taking? Authorizing Provider  albuterol  (VENTOLIN  HFA) 108 (90 Base) MCG/ACT inhaler Inhale 2 puffs into the lungs every 4 (four) hours as needed for wheezing or shortness of breath. 06/06/24  Yes Ula Prentice Grieves, MD  albuterol  (VENTOLIN  HFA) 108 (90 Base) MCG/ACT inhaler Inhale 1 puff into the lungs every 6 (six) hours as needed for wheezing or shortness of breath.   Yes [provider]  ALPRAZolam  (XANAX ) 0.25 MG tablet Take 0.25 mg by mouth at bedtime. Prn anxiety 07/11/16  Yes [provider]  ezetimibe  (ZETIA ) 10 MG tablet Take 10 mg by mouth at bedtime. 10/28/16  Yes [provider]  fenofibrate  160 MG tablet Take 160 mg by mouth daily. 07/03/22  Yes [provider]  magnesium  oxide (MAG-OX) 400 (240 Mg) MG tablet Take 400 mg by mouth daily. At night   Yes [provider]  Multiple Minerals (CALCIUM-MAGNESIUM -ZINC) TABS Take 1 tablet by mouth daily.   Yes [provider]  omeprazole (PRILOSEC) 20 MG capsule Take 1  capsule by mouth daily. 05/14/20  Yes [provider]  predniSONE  (DELTASONE ) 50 MG tablet Take one tablet by mouth daily 06/06/24  Yes Ula Prentice Vrba, MD  TRELEGY ELLIPTA 100-62.5-25 MCG/ACT AEPB Inhale 1 puff into the lungs daily.   Yes [provider]  VITAMIN E PO Take 1 tablet by mouth daily.   Yes [provider]    Allergies: Penicillins, Procaine, and Statins    Review of Systems  Cardiovascular:  Positive for chest pain.  All other systems reviewed and are negative.   Updated Vital Signs BP 128/63 (BP Location: Left Arm)   Pulse 78   Temp (!) 97.4 F (36.3 C) (Oral)   Resp 16   Ht 4' 11 (1.499 m)   Wt 43.1 kg   SpO2 93%   BMI 19.19 kg/m   Gen: NAD Eyes: PERRL, EOMI HEENT: no oropharyngeal swelling Neck: trachea midline Resp: clear to auscultation bilaterally Card: Tachycardic, no murmurs, rubs, or gallops Abd: nontender, nondistended Extremities: no calf tenderness, no edema Vascular: 2+ radial pulses bilaterally, 2+ DP pulses bilaterally Skin: no rashes Psyc: acting appropriately    (all labs ordered are listed, but only abnormal results are displayed) Labs Reviewed  CBC - Abnormal; Notable for the following components:      Result Value   Platelets 458 (*)    All other components within normal limits  BASIC METABOLIC PANEL WITH GFR - Abnormal; Notable  for the following components:   Sodium 132 (*)    Chloride 97 (*)    Glucose, Bld 110 (*)    All other components within normal limits  BASIC METABOLIC PANEL WITH GFR - Abnormal; Notable for the following components:   Sodium 134 (*)    Chloride 97 (*)    Glucose, Bld 101 (*)    Calcium 10.5 (*)    All other components within normal limits  CBC WITH DIFFERENTIAL/PLATELET - Abnormal; Notable for the following components:   WBC 10.7 (*)    Platelets 485 (*)    Neutro Abs 8.6 (*)    Monocytes Absolute 1.2 (*)    All other components within normal limits  OSMOLALITY, URINE -  Abnormal; Notable for the following components:   Osmolality, Ur 241 (*)    All other components within normal limits  CBC - Abnormal; Notable for the following components:   Platelets 474 (*)    All other components within normal limits  BASIC METABOLIC PANEL WITH GFR - Abnormal; Notable for the following components:   Sodium 132 (*)    Chloride 97 (*)    Glucose, Bld 144 (*)    All other components within normal limits  MRSA NEXT GEN BY PCR, NASAL  RESP PANEL BY RT-PCR (RSV, FLU A&B, COVID)  RVPGX2  TSH  SODIUM, URINE, RANDOM  PROTIME-INR  PROCALCITONIN  TROPONIN T, HIGH SENSITIVITY    EKG: EKG Interpretation Date/Time:  Monday June 06 2024 09:47:01 EST Ventricular Rate:  111 PR Interval:  135 QRS Duration:  122 QT Interval:  360 QTC Calculation: 490 R Axis:   91  Text Interpretation: Sinus tachycardia RBBB and LPFB Confirmed by Ula Barter [phone]) on 06/06/2024 9:56:14 AM  Radiology: US  Abdomen Limited Result Date: 06/09/2024 CLINICAL DATA:  [id number] Liver mass [id number]. New diagnosis of spiculated RIGHT lung mass suspicious for malignancy, and hypodense hepatic lesions. Question hepatic metastases. EXAM: ULTRASOUND ABDOMEN LIMITED COMPARISON:  CTA chest, 06/06/2024.  CT AP, 06/07/2024. FINDINGS: Focused ultrasound at the along the LEFT and RIGHT hepatic lobes correlating with hypodense masses seen on comparison CT. Demonstrated within the LEFT and RIGHT hepatic lobes are well-circumscribed anechoic and avascular hepatic fluid collections measuring up to 1.7 and 1.0 cm respectively. No solid or additional suspicious hepatic lesion is identified. IMPRESSION: LEFT and RIGHT hepatic lobe hypodensities on comparison CT are consistent with simple-appearing hepatic cysts. Liver mass biopsy was NOT performed. Thom Hall, MD [facility] Radiology Electronically Signed   By: Thom Hall M.D.   On: 06/09/2024 08:44     Procedures    Medications Ordered in the ED  fentaNYL  (SUBLIMAZE ) injection 50 mcg (has no administration in time range)  fenofibrate  tablet 160 mg (160 mg Oral Given 06/09/24 0930)  pantoprazole  (PROTONIX ) EC tablet 40 mg (40 mg Oral Given 06/09/24 0930)  budesonide -glycopyrrolate -formoterol  (BREZTRI ) 160-9-4.8 MCG/ACT inhaler 2 puff (2 puffs Inhalation Given 06/09/24 1854)  melatonin tablet 3 mg (3 mg Oral Given 06/09/24 2247)  magnesium  oxide (MAG-OX) tablet 400 mg (400 mg Oral Given 06/09/24 2247)  albuterol  (PROVENTIL ) (2.5 MG/3ML) 0.083% nebulizer solution 2.5 mg (has no administration in time range)  ezetimibe  (ZETIA ) tablet 10 mg (10 mg Oral Given 06/09/24 2247)  ALPRAZolam  (XANAX ) tablet 0.25 mg (0.25 mg Oral Given 06/09/24 2247)  sodium chloride  flush (NS) 0.9 % injection 3 mL (3 mLs Intravenous Given 06/09/24 2248)  acetaminophen  (TYLENOL ) tablet 650 mg (650 mg Oral Given 06/08/24 2135)  Or  acetaminophen  (TYLENOL ) suppository 650 mg ( Rectal See Alternative 06/08/24 2135)  guaiFENesin  (MUCINEX ) 12 hr tablet 600 mg (600 mg Oral Given 06/09/24 2247)  LORazepam  (ATIVAN ) injection 0.5 mg (has no administration in time range)  albuterol  (PROVENTIL ) (2.5 MG/3ML) 0.083% nebulizer solution 2.5 mg (2.5 mg Nebulization Given 06/09/24 1854)  ALPRAZolam  (XANAX ) tablet 0.5 mg (0.5 mg Oral Not Given 06/08/24 1151)  ondansetron  (ZOFRAN ) injection 4 mg (has no administration in time range)  HYDROcodone -acetaminophen  (NORCO/VICODIN) 5-325 MG per tablet 1 tablet (1 tablet Oral Given 06/09/24 2247)  predniSONE  (DELTASONE ) tablet 20 mg (20 mg Oral Given 06/10/24 0701)  morphine  (PF) 2 MG/ML injection 2 mg (2 mg Intravenous Given 06/06/24 1034)  ondansetron  (ZOFRAN ) injection 4 mg (4 mg Intravenous Given 06/06/24 1034)  iohexol  (OMNIPAQUE ) 350 MG/ML injection 100 mL (75 mLs Intravenous Contrast Given 06/06/24 1334)  ipratropium-albuterol  (DUONEB) 0.5-2.5 (3) MG/3ML nebulizer solution 3 mL (3 mLs Nebulization Given 06/06/24  1453)  methylPREDNISolone  sodium succinate (SOLU-MEDROL ) 125 mg/2 mL injection 125 mg (125 mg Intravenous Given 06/06/24 1457)  iohexol  (OMNIPAQUE ) 350 MG/ML injection 75 mL (75 mLs Intravenous Contrast Given 06/07/24 2011)  midazolam  PF (VERSED ) injection 0.5 mg (0.5 mg Intravenous Given 06/08/24 1133)                                    Medical Decision Making 84 year old female with past medical history of COPD and hyperlipidemia presenting to the emergency department today with chest pain.  I will further evaluate the patient here with basic labs Wels and EKG, chest x-ray, and troponin for further evaluation for ACS, pulmonary edema, pulmonary infiltrates, pneumothorax.  If her chest x-ray is unrevealing we will go forward with CT angiogram to evaluate for pulmonary embolism given the description of her symptoms.  With the duration of her symptoms of 2 weeks and reassuring neurovascular exam suspicion for aortic dissection is low at this time.  The patient morphine  Zofran  for symptoms and reevaluate for ultimate disposition.  The patient initial troponin is negative.  CT angiogram is pending at time signout.  Plan is for reassessment but will likely require admission as she is requiring oxygen.  Amount and/or Complexity of Data Reviewed Labs: ordered. Radiology: ordered.  Risk Prescription drug management. Decision regarding hospitalization.        Final diagnoses:  Nonspecific chest pain  Lung mass  Acute respiratory failure with hypoxia [facility])    ED Discharge Orders          Ordered    predniSONE  (DELTASONE ) 50 MG tablet        06/06/24 1501    albuterol  (VENTOLIN  HFA) 108 (90 Base) MCG/ACT inhaler  Every 4 hours PRN        06/06/24 1501               Ula Prentice Gilder, MD 06/10/24 0703  "

## 2024-06-06 NOTE — ED Notes (Signed)
 Pt sats in high 80's, [place] 1.5 l applied Sats 96 no resp no distress noted. CMP, Tropinin redrawn as directed

## 2024-06-06 NOTE — ED Notes (Signed)
 Patient transported to CT

## 2024-06-06 NOTE — Progress Notes (Signed)
 Brief note: -Likely transfer from [facility] health [facility]. - Discussed with Dr. Jerrol.  - Patient is an 84 year old female with history of COPD.  Patient presented today [facility] with 2-week history of shortness of breath.  O2 sat was noted to be in the 70s to 80s on room air, but responded to supplemental oxygen.  Negative troponin.  Tachycardia is noted.  CTA chest was negative for pulmonary embolism.  CTA chest revealed spiculated subpleural right upper lobe mass measuring 2.1 x 1.2 cm, concerning for bronchogenic malignancy, right hilar adenopathy and indeterminate left lobe liver hypodensity, also concerning for metastatic disease.  CTA chest revealed: 1. No evidence of pulmonary embolus. 2. Spiculated subpleural right upper lobe mass measuring 2.1 x 1.2 cm, concerning for bronchogenic malignancy. PET scan may be useful. 3. Pathologic right paratracheal and right hilar adenopathy, concerning for nodal metastases. 4. Indeterminate left lobe liver hypodensity, also concerning for metastatic disease. 5. Soft tissue mass in the left suprarenal fossa, which may be of adrenal or renal origin. Further imaging with dedicated abdominal and pelvic CT with IV and oral contrast may be useful. 6. Aortic Atherosclerosis (ICD10-I70.0) and Emphysema (ICD10-J43.9).    - Patient will be transferred to stepdown unit. -Please notify the hospitalist team when patient arrives.

## 2024-06-06 NOTE — ED Notes (Addendum)
 Taken off oxygen for trial after neb treatment.  SAT dropped to 87%. Placed back on 1L

## 2024-06-06 NOTE — ED Provider Notes (Signed)
" °  Physical Exam  BP (!) 154/61   Pulse 93   Temp 98 F (36.7 C)   Resp 20   Wt 46.3 kg   SpO2 96%   BMI 20.60 kg/m   Physical Exam  Procedures  Procedures  ED Course / MDM    Medical Decision Making Amount and/or Complexity of Data Reviewed Labs: ordered. Radiology: ordered.  Risk Prescription drug management. Decision regarding hospitalization.   9F, hx of COPD, presenting with chest pain, CT pending.  Patient had been off oxygen but subsequently desaturated to the 70s, placed back on 1 L O2 via nasal cannula. Cardiac troponin normal.   CTA PE:  IMPRESSION:  1. No evidence of pulmonary embolus.  2. Spiculated subpleural right upper lobe mass measuring 2.1 x 1.2  cm, concerning for bronchogenic malignancy. PET scan may be useful.  3. Pathologic right paratracheal and right hilar adenopathy,  concerning for nodal metastases.  4. Indeterminate left lobe liver hypodensity, also concerning for  metastatic disease.  5. Soft tissue mass in the left suprarenal fossa, which may be of  adrenal or renal origin. Further imaging with dedicated abdominal  and pelvic CT with IV and oral contrast may be useful.  6. Aortic Atherosclerosis (ICD10-I70.0) and Emphysema (ICD10-J43.9).   Patient CT imaging concerning for new onset lung cancer with nodal metastases, within the setting of new hypoxic respiratory failure, medicine consulted for admission.  Discussed the findings of concern for malignancy and metastasis based on CT imaging with the patient bedside and patient updated regarding plan for admission.  All questions answered at time of admission. Dr. Rosario consulted and accepted the patient in admission.     Jerrol Agent, MD 06/06/24 1655  "

## 2024-06-06 NOTE — ED Triage Notes (Signed)
 Pt reports chest pain thatgoes all the around to back x 2 weeks. Denies N/V/D. SOB has gotten worse. Pain is sharp and hurts worse with breathing. Denies fever and chills

## 2024-06-07 ENCOUNTER — Telehealth (HOSPITAL_BASED_OUTPATIENT_CLINIC_OR_DEPARTMENT_OTHER): Payer: Self-pay | Admitting: Pulmonary Disease

## 2024-06-07 ENCOUNTER — Inpatient Hospital Stay (HOSPITAL_COMMUNITY)

## 2024-06-07 DIAGNOSIS — R918 Other nonspecific abnormal finding of lung field: Secondary | ICD-10-CM | POA: Diagnosis present

## 2024-06-07 DIAGNOSIS — R079 Chest pain, unspecified: Secondary | ICD-10-CM | POA: Diagnosis not present

## 2024-06-07 DIAGNOSIS — E871 Hypo-osmolality and hyponatremia: Secondary | ICD-10-CM | POA: Diagnosis present

## 2024-06-07 DIAGNOSIS — R634 Abnormal weight loss: Secondary | ICD-10-CM | POA: Diagnosis present

## 2024-06-07 DIAGNOSIS — K219 Gastro-esophageal reflux disease without esophagitis: Secondary | ICD-10-CM | POA: Diagnosis present

## 2024-06-07 DIAGNOSIS — Z66 Do not resuscitate: Secondary | ICD-10-CM | POA: Diagnosis present

## 2024-06-07 DIAGNOSIS — J9691 Respiratory failure, unspecified with hypoxia: Secondary | ICD-10-CM

## 2024-06-07 DIAGNOSIS — E785 Hyperlipidemia, unspecified: Secondary | ICD-10-CM | POA: Diagnosis present

## 2024-06-07 DIAGNOSIS — J449 Chronic obstructive pulmonary disease, unspecified: Secondary | ICD-10-CM | POA: Diagnosis not present

## 2024-06-07 DIAGNOSIS — R599 Enlarged lymph nodes, unspecified: Secondary | ICD-10-CM | POA: Diagnosis not present

## 2024-06-07 DIAGNOSIS — I700 Atherosclerosis of aorta: Secondary | ICD-10-CM | POA: Diagnosis present

## 2024-06-07 DIAGNOSIS — G4700 Insomnia, unspecified: Secondary | ICD-10-CM | POA: Diagnosis present

## 2024-06-07 DIAGNOSIS — R9431 Abnormal electrocardiogram [ECG] [EKG]: Secondary | ICD-10-CM | POA: Diagnosis present

## 2024-06-07 DIAGNOSIS — D72829 Elevated white blood cell count, unspecified: Secondary | ICD-10-CM | POA: Diagnosis present

## 2024-06-07 DIAGNOSIS — C7989 Secondary malignant neoplasm of other specified sites: Secondary | ICD-10-CM

## 2024-06-07 DIAGNOSIS — G3184 Mild cognitive impairment, so stated: Secondary | ICD-10-CM | POA: Diagnosis present

## 2024-06-07 DIAGNOSIS — Z7951 Long term (current) use of inhaled steroids: Secondary | ICD-10-CM | POA: Diagnosis not present

## 2024-06-07 DIAGNOSIS — J441 Chronic obstructive pulmonary disease with (acute) exacerbation: Secondary | ICD-10-CM | POA: Diagnosis present

## 2024-06-07 DIAGNOSIS — I452 Bifascicular block: Secondary | ICD-10-CM | POA: Diagnosis present

## 2024-06-07 DIAGNOSIS — C799 Secondary malignant neoplasm of unspecified site: Secondary | ICD-10-CM | POA: Diagnosis present

## 2024-06-07 DIAGNOSIS — C3411 Malignant neoplasm of upper lobe, right bronchus or lung: Secondary | ICD-10-CM | POA: Diagnosis present

## 2024-06-07 DIAGNOSIS — Z1152 Encounter for screening for COVID-19: Secondary | ICD-10-CM | POA: Diagnosis not present

## 2024-06-07 DIAGNOSIS — Z7952 Long term (current) use of systemic steroids: Secondary | ICD-10-CM | POA: Diagnosis not present

## 2024-06-07 DIAGNOSIS — Z87891 Personal history of nicotine dependence: Secondary | ICD-10-CM | POA: Diagnosis not present

## 2024-06-07 DIAGNOSIS — Z79899 Other long term (current) drug therapy: Secondary | ICD-10-CM | POA: Diagnosis not present

## 2024-06-07 DIAGNOSIS — Z8249 Family history of ischemic heart disease and other diseases of the circulatory system: Secondary | ICD-10-CM | POA: Diagnosis not present

## 2024-06-07 DIAGNOSIS — Z9049 Acquired absence of other specified parts of digestive tract: Secondary | ICD-10-CM | POA: Diagnosis not present

## 2024-06-07 DIAGNOSIS — J9601 Acute respiratory failure with hypoxia: Secondary | ICD-10-CM | POA: Diagnosis present

## 2024-06-07 DIAGNOSIS — E7800 Pure hypercholesterolemia, unspecified: Secondary | ICD-10-CM | POA: Diagnosis present

## 2024-06-07 DIAGNOSIS — Z681 Body mass index (BMI) 19 or less, adult: Secondary | ICD-10-CM | POA: Diagnosis not present

## 2024-06-07 DIAGNOSIS — R590 Localized enlarged lymph nodes: Secondary | ICD-10-CM | POA: Diagnosis present

## 2024-06-07 DIAGNOSIS — J439 Emphysema, unspecified: Secondary | ICD-10-CM | POA: Diagnosis present

## 2024-06-07 DIAGNOSIS — J9620 Acute and chronic respiratory failure, unspecified whether with hypoxia or hypercapnia: Secondary | ICD-10-CM | POA: Diagnosis not present

## 2024-06-07 DIAGNOSIS — K7689 Other specified diseases of liver: Secondary | ICD-10-CM | POA: Diagnosis present

## 2024-06-07 LAB — CBC WITH DIFFERENTIAL/PLATELET
Abs Immature Granulocytes: 0.04 K/uL (ref 0.00–0.07)
Basophils Absolute: 0 K/uL (ref 0.0–0.1)
Basophils Relative: 0 %
Eosinophils Absolute: 0 K/uL (ref 0.0–0.5)
Eosinophils Relative: 0 %
HCT: 39.5 % (ref 36.0–46.0)
Hemoglobin: 13.5 g/dL (ref 12.0–15.0)
Immature Granulocytes: 0 %
Lymphocytes Relative: 8 %
Lymphs Abs: 0.8 K/uL (ref 0.7–4.0)
MCH: 32.1 pg (ref 26.0–34.0)
MCHC: 34.2 g/dL (ref 30.0–36.0)
MCV: 94 fL (ref 80.0–100.0)
Monocytes Absolute: 1.2 K/uL — ABNORMAL HIGH (ref 0.1–1.0)
Monocytes Relative: 11 %
Neutro Abs: 8.6 K/uL — ABNORMAL HIGH (ref 1.7–7.7)
Neutrophils Relative %: 81 %
Platelets: 485 K/uL — ABNORMAL HIGH (ref 150–400)
RBC: 4.2 MIL/uL (ref 3.87–5.11)
RDW: 12.4 % (ref 11.5–15.5)
WBC: 10.7 K/uL — ABNORMAL HIGH (ref 4.0–10.5)
nRBC: 0 % (ref 0.0–0.2)

## 2024-06-07 LAB — BASIC METABOLIC PANEL WITH GFR
Anion gap: 13 (ref 5–15)
BUN: 12 mg/dL (ref 8–23)
CO2: 24 mmol/L (ref 22–32)
Calcium: 10.5 mg/dL — ABNORMAL HIGH (ref 8.9–10.3)
Chloride: 97 mmol/L — ABNORMAL LOW (ref 98–111)
Creatinine, Ser: 0.62 mg/dL (ref 0.44–1.00)
GFR, Estimated: >60 mL/min
Glucose, Bld: 101 mg/dL — ABNORMAL HIGH (ref 70–99)
Potassium: 4.7 mmol/L (ref 3.5–5.1)
Sodium: 134 mmol/L — ABNORMAL LOW (ref 135–145)

## 2024-06-07 LAB — SODIUM, URINE, RANDOM: Sodium, Ur: <30 mmol/L

## 2024-06-07 LAB — RESP PANEL BY RT-PCR (RSV, FLU A&B, COVID)  RVPGX2
Influenza A by PCR: NEGATIVE
Influenza B by PCR: NEGATIVE
Resp Syncytial Virus by PCR: NEGATIVE
SARS Coronavirus 2 by RT PCR: NEGATIVE

## 2024-06-07 LAB — MRSA NEXT GEN BY PCR, NASAL: MRSA by PCR Next Gen: NOT DETECTED

## 2024-06-07 LAB — OSMOLALITY, URINE: Osmolality, Ur: 241 mosm/kg — ABNORMAL LOW (ref 300–900)

## 2024-06-07 LAB — TSH: TSH: 1.01 u[IU]/mL (ref 0.350–4.500)

## 2024-06-07 LAB — PROCALCITONIN: Procalcitonin: <0.1 ng/mL

## 2024-06-07 MED ORDER — ACETAMINOPHEN 325 MG PO TABS
650.0000 mg | ORAL_TABLET | Freq: Four times a day (QID) | ORAL | Status: DC | PRN
  Administered 2024-06-08: 650 mg via ORAL
  Filled 2024-06-07: qty 2

## 2024-06-07 MED ORDER — ALPRAZOLAM 0.25 MG PO TABS
0.2500 mg | ORAL_TABLET | Freq: Every day | ORAL | Status: DC
  Administered 2024-06-07 – 2024-06-09 (×3): 0.25 mg via ORAL
  Filled 2024-06-07 (×3): qty 1

## 2024-06-07 MED ORDER — SODIUM CHLORIDE 0.9% FLUSH
3.0000 mL | Freq: Two times a day (BID) | INTRAVENOUS | Status: DC
  Administered 2024-06-07 – 2024-06-10 (×6): 3 mL via INTRAVENOUS

## 2024-06-07 MED ORDER — PREDNISONE 20 MG PO TABS
40.0000 mg | ORAL_TABLET | Freq: Every day | ORAL | Status: DC
  Administered 2024-06-07 – 2024-06-09 (×3): 40 mg via ORAL
  Filled 2024-06-07 (×3): qty 2

## 2024-06-07 MED ORDER — ACETAMINOPHEN 650 MG RE SUPP: 650.0000 mg | Freq: Four times a day (QID) | RECTAL | Status: DC | PRN

## 2024-06-07 MED ORDER — MAGNESIUM OXIDE -MG SUPPLEMENT 400 (240 MG) MG PO TABS
400.0000 mg | ORAL_TABLET | Freq: Every day | ORAL | Status: DC
  Administered 2024-06-07 – 2024-06-09 (×3): 400 mg via ORAL
  Filled 2024-06-07 (×3): qty 1

## 2024-06-07 MED ORDER — ENOXAPARIN SODIUM 30 MG/0.3ML IJ SOSY: 30.0000 mg | PREFILLED_SYRINGE | INTRAMUSCULAR | Status: DC

## 2024-06-07 MED ORDER — LORAZEPAM 2 MG/ML IJ SOLN: 0.5000 mg | Freq: Once | INTRAMUSCULAR | Status: DC | PRN

## 2024-06-07 MED ORDER — EZETIMIBE 10 MG PO TABS
10.0000 mg | ORAL_TABLET | Freq: Every day | ORAL | Status: DC
  Administered 2024-06-07 – 2024-06-09 (×3): 10 mg via ORAL
  Filled 2024-06-07 (×3): qty 1

## 2024-06-07 MED ORDER — ALBUTEROL SULFATE (2.5 MG/3ML) 0.083% IN NEBU: 2.5000 mg | INHALATION_SOLUTION | Freq: Four times a day (QID) | RESPIRATORY_TRACT | Status: DC | PRN

## 2024-06-07 MED ORDER — ALBUTEROL SULFATE (2.5 MG/3ML) 0.083% IN NEBU
2.5000 mg | INHALATION_SOLUTION | Freq: Four times a day (QID) | RESPIRATORY_TRACT | Status: DC
  Administered 2024-06-08: 2.5 mg via RESPIRATORY_TRACT
  Filled 2024-06-07 (×2): qty 3

## 2024-06-07 MED ORDER — IOHEXOL 350 MG/ML SOLN
75.0000 mL | Freq: Once | INTRAVENOUS | Status: AC | PRN
  Administered 2024-06-07: 75 mL via INTRAVENOUS

## 2024-06-07 MED ORDER — GUAIFENESIN ER 600 MG PO TB12
600.0000 mg | ORAL_TABLET | Freq: Two times a day (BID) | ORAL | Status: DC
  Administered 2024-06-07 – 2024-06-10 (×6): 600 mg via ORAL
  Filled 2024-06-07 (×6): qty 1

## 2024-06-07 MED ORDER — PREDNISONE 20 MG PO TABS: 40.0000 mg | ORAL_TABLET | Freq: Every day | ORAL | Status: DC

## 2024-06-07 NOTE — H&P (Addendum)
 " History and Physical    Patient: Cynthia Walton FMW:[id number] DOB: February 20, 1941 DOA: 06/06/2024 DOS: the patient was seen and examined on 06/07/2024 PCP: Alray Loader, MD  Patient coming from: Home  Chief Complaint:  Chief Complaint  Patient presents with   Chest Pain   HPI: Cynthia Walton is a 84 y.o. female with medical history significant of COPD, hyperlipidemia, GERD, and prior tobacco abuse presented with progressively worsening shortness of breath and chest pain. She is accompanied by her sister and son.  She experiences progressively worsening shortness of breath, particularly noticeable at night. She does not use oxygen therapy. She has a significant history of smoking, having quit 20 years ago after smoking for at least 30 years. No wheezing or coughing.  She has pain primarily in the upper chest and back, with the back being the most painful area. The pain is exacerbated by straining and movements.  She reports a weight loss of approximately 12 pounds over the past few months.  In the emergency department patient was noted to be afebrile with respirations 13-28, pulse 72-130, blood pressures 98, and O2 saturations in the 80s on room air with improvement on 1.5 L of nasal cannula oxygen O2 96%.  Labs 1/12 noted WBC 9.4, platelets 458, sodium 132, chloride 97, glucose 110, and high-sensitivity troponin 15.  Chest x-ray noted emphysema without acute abnormality.  CT angiogram of the chest noted no evidence of pulmonary embolism with a spiculated subpleural right upper lobe mass measuring 2.1 x 1.2 cm concerning for bronchogenic malignancy with pathologic right paratracheal and right hilar adenopathy concerning for nodal metastasis with indeterminate left lobe liver hypodensity concerning for metastatic disease, and soft tissue mass of the left pararenal fossa which may be adrenal or renal origin.  Repeat labs today noted WBC 10.7, platelets 485, sodium 134, and calcium 10.5  Review  of Systems: As mentioned in the history of present illness. All other systems reviewed and are negative. Past Medical History:  Diagnosis Date   Acid reflux    COPD (chronic obstructive pulmonary disease) (HCC)    Hypercholesterolemia    Past Surgical History:  Procedure Laterality Date   APPENDECTOMY  1962   ruptured ovarian cyst   BREAST LUMPECTOMY     CHOLECYSTECTOMY     GALLBLADDER SURGERY     removed stones   Social History:  reports that she has quit smoking. She has never used smokeless tobacco. She reports that she does not currently use alcohol. She reports that she does not use drugs.  Allergies[1]  Family History  Problem Relation Age of Onset   Heart disease Mother    Other Father        old age    Prior to Admission medications  Medication Sig Start Date End Date Taking? Authorizing Provider  albuterol  (VENTOLIN  HFA) 108 (90 Base) MCG/ACT inhaler Inhale 2 puffs into the lungs every 4 (four) hours as needed for wheezing or shortness of breath. 06/06/24  Yes Ula Prentice Giannotti, MD  albuterol  (VENTOLIN  HFA) 108 [phone] Base) MCG/ACT inhaler Inhale into the lungs every 6 (six) hours as needed for wheezing or shortness of breath.   Yes [provider]  ALPRAZolam  (XANAX ) 0.25 MG tablet Take 0.25 mg by mouth at bedtime. Prn anxiety 07/11/16  Yes [provider]  estradiol (CLIMARA - DOSED IN MG/24 HR) 0.1 mg/24hr patch Place 0.1 mg onto the skin once a week. 2x/week   Yes [provider]  fenofibrate  160 MG  tablet Take 160 mg by mouth daily. 07/03/22  Yes [provider]  magnesium  oxide (MAG-OX) 400 (240 Mg) MG tablet Take 400 mg by mouth daily. At night   Yes [provider]  predniSONE  (DELTASONE ) 50 MG tablet Take one tablet by mouth daily 06/06/24  Yes Ula Prentice Impastato, MD  cyclobenzaprine  (FLEXERIL ) 5 MG tablet Take 1 tablet (5 mg total) by mouth 2 (two) times daily as needed for up to 15 doses for muscle spasms. Patient not taking:  Reported on 09/18/2020 07/16/20   Ruthe Cornet, DO  ezetimibe  (ZETIA ) 10 MG tablet Take 10 mg by mouth at bedtime. 10/28/16   [provider]  Multiple Minerals (CALCIUM-MAGNESIUM -ZINC) TABS Take by mouth.    [provider]  omeprazole (PRILOSEC) 20 MG capsule Take 1 capsule by mouth daily. 05/14/20   [provider]  STIOLTO RESPIMAT 2.5-2.5 MCG/ACT AERS Take 1 puff by mouth daily. 06/25/20   [provider]  TRELEGY ELLIPTA 100-62.5-25 MCG/ACT AEPB Inhale 1 puff into the lungs daily.    [provider]  VITAMIN E PO Take by mouth.    [provider]    Physical Exam: Vitals:   06/07/24 1200 06/07/24 1230 06/07/24 1300 06/07/24 1409  BP: 138/73 129/80 120/68 (!) 153/71  Pulse: 89 87 87 94  Resp:    18  Temp:    98.6 F (37 C)  TempSrc:    Oral  SpO2: 91% 93% 93% 94%  Weight:    43.1 kg  Height:    4' 11 (1.499 m)    Constitutional: Elderly female NAD, calm, comfortable Eyes: PERRL, lids and conjunctivae normal ENMT: Mucous membranes are moist. Posterior pharynx clear of any exudate or lesions.   Neck: normal, supple   Respiratory: Decreased aeration without significant wheezes or rhonchi appreciated at this time.  O2 saturation currently maintained on 1.5 L of oxygen.  Patient able to talk in fairly complete sentences Cardiovascular: Regular rate and rhythm, no murmurs / rubs / gallops. No extremity edema. . No carotid bruits.  Abdomen: no tenderness, no masses palpated.  Bowel sounds appreciated in all 4 quadrants Musculoskeletal: no clubbing / cyanosis. No joint deformity upper and lower extremities. Good ROM, no contractures. Normal muscle tone.  Skin: no rashes, lesions, ulcers. No induration Neurologic: CN 2-12 grossly intact.  Strength 5/5 in all 4.  Psychiatric: Recent memory is decreased.  Alert and oriented x 3. Normal mood.   Data Reviewed:  EKG reveals sinus tachycardia at 111 bpm with RBBB and LPFB.  Reviewed labs,  imaging, and pertinent records as documented.  Assessment and Plan:  Acute respiratory failure with hypoxia COPD Patient noted to be hypoxic with O2 saturations as low as 87% with improvement on 1.5 L of nasal cannula oxygen.  Normally not on oxygen at baseline.  Decreased aeration but no significant wheezing appreciated on physical exam.  Chest x-ray noted signs for emphysema without acute abnormality.  CT angiogram of the chest did not note any signs of a pulmonary embolism. - Admit to a telemetry bed - Check procalcitonin and respiratory virus panel - Continue Breztri  - Albuterol  nebs 4 times daily and as needed - Prednisone  40 mg p.o. daily  Lung mass  Suspected metastatic disease Acute.  Patient presents with worsening chest pain, dyspnea, unintentional weight loss of 12 pounds.  CT showing a 2.1 x 1.2 cm spiculated right upper lobe mass with pathologic right paratracheal and hilar adenopathy, indeterminate left liver hypodensity, and left  pararenal soft tissue mass.  Question possible bronchogenic carcinoma.  Risk factors include significant history of tobacco abuse.  Imaging - Check MRI of the brain with and without contrast(patient declined MRI due to being claustrophobic even with Ativan  offered and therefore CT ordered instead) and CT scan of abdomen/pelvis for further staging - N.p.o. after midnight - Appreciate IR consulted for possible liver biopsy in a.m. - Appreciate pulmonary consultative,  will follow-up for any further recommendations  Leukocytosis Acute.  WBC elevated 10.7.  No clear source of infection noted at this time. - Continue to monitor  Chest pain Abnormal EKG Acute.  Patient presents with complaints of chest pain wrapping around to her back.  high-sensitivity troponin  was negative and EKG with right bundle branch block and left posterior fascicular block.  Thought secondary to above.    - May warrant further workup after acute issues have been  addressed  Hyponatremia Acute.  Sodium was noted to be 132->134 Question possibility of SIADH. - Check urine sodium and urine osmolarity - Continue to monitor sodium level  Hypercalcemia Acute.  Calcium 10.5.  Possibly related with underlying malignancy. - Continue to monitor  Mild cognitive impairment Family makes note the patient has some issues with her memory. - Delirium precautions  Hyperlipidemia - Continue Zetia  and fenofibrate    Anxiety/insomnia - Continue Xanax  as needed.    GERD - Continue omeprazole  DVT prophylaxis: SCDs for procedure in a.m. Will need to start Lovenox  when deemed appropriate Advance Care Planning:   Code Status: Full Code    Consults: Pulmonology, interventional radiology  Family Communication: Patient's sister and son updated at bedside  Severity of Illness: The appropriate patient status for this patient is INPATIENT. Inpatient status is judged to be reasonable and necessary in order to provide the required intensity of service to ensure the patient's safety. The patient's presenting symptoms, physical exam findings, and initial radiographic and laboratory data in the context of their chronic comorbidities is felt to place them at high risk for further clinical deterioration. Furthermore, it is not anticipated that the patient will be medically stable for discharge from the hospital within 2 midnights of admission.   * I certify that at the point of admission it is my clinical judgment that the patient will require inpatient hospital care spanning beyond 2 midnights from the point of admission due to high intensity of service, high risk for further deterioration and high frequency of surveillance required.*  Author: Maximino DELENA Sharps, MD 06/07/2024 2:31 PM  For on call review [email or web address].      [1]  Allergies Allergen Reactions   Penicillins Hives, Rash and Swelling   Procaine Other (See Comments)    Mouth decay     Statins Other (See  Comments)    Muscle aches Muscle aches    "

## 2024-06-07 NOTE — Consult Note (Signed)
 "  NAME:  Cynthia Walton, MRN:  [id number], DOB:  05/13/1941, LOS: 0 ADMISSION DATE:  06/06/2024, CONSULTATION DATE:  06/07/24 REFERRING MD:  Maximino Sharps, MD CHIEF COMPLAINT:  Right lung mass   History of Present Illness:  84 year old female former smoker >30 years with severe emphysema, HLD, GERD who presented with shortness of breath and chest pain. Endorses weight loss 12 lbs in the last 3 months. Denies cough or wheezing. In the ED vitals significant for hypoxemia to 80s and started on 1-2L O2.  PCCM consulted for CTA neg for PE but significant for spiculated right upper lobe mass 2.1 x 1.2 cm with background severe emphysema. Mediastinal enlargement also seen including right paratracheal and right hilar adenopathy. Left lobe liver hypodensity also seen. IR planning for liver biopsy. PCCM consulted to discuss alternative options.    Pertinent  Medical History  As above  Significant Hospital Events: Including procedures, antibiotic start and stop dates in addition to other pertinent events     Interim History / Subjective:  As above  Objective    Blood pressure 111/84, pulse 100, temperature 98.7 F (37.1 C), temperature source Oral, resp. rate 19, height 4' 11 (1.499 m), weight 43.1 kg, SpO2 94%.       No intake or output data in the 24 hours ending 06/07/24 1746 Filed Weights   06/06/24 0950 06/07/24 1409  Weight: 46.3 kg 43.1 kg   Physical Exam: General: Chronically ill and elderly-appearing, no acute distress HENT: [place], AT, OP clear, MMM Eyes: EOMI, no scleral icterus Respiratory: Diminished to auscultation bilaterally.  No crackles, wheezing or rales Cardiovascular: RRR, -M/R/G, no JVD Extremities:-Edema,-tenderness Neuro: AAO x4, CNII-XII grossly intact Psych: Normal mood, normal affect  Imaging, labs and test in EMR in the last 24 hours reviewed independently by me. Pertinent findings below: CTA  Resolved problem list   Assessment and Plan   Spiculated  subpleural right upper lobe lung mass measuring 2.1 x 1.2 cm Right paratracheal and right hilar adenopathy Left lobe liver hypodensity concerning for mets Emphysema/COPD exacerbation Acute vs chronic hypoxemic respiratory failure  I reviewed imaging with patient and sister at bedside. Based on imaging, lung mass is highly concerning for malignancy with liver lesion suggestive of metastatic foci.   We discussed diagnostic testing including CT-guided biopsy of liver, navigational bronchoscopy and EBUS bronchoscopy. Agree that liver biopsy would be the best option as navigational bronchoscopy would carry a higher pulmonary post-op risk including pneumothorax and respiratory failure due to the location of the lesion and severe emphysema. We would only pursue bronchoscopy if unable to obtain liver biopsy or if testing nondiagnostic. Would recommend EBUS to minimize pulmonary risk. Addressed questions and concerns in detail with patient and family.  Regarding her emphysema recommend starting triple therapy. Breztri  ordered. New O2 requirement may be chronic. Reasonable to treated with prednisone  for presumed exacerbation. Will need ambulatory O2 prior to discharge to determine home O2 needs. Will arrange pulmonary follow-up in 2 weeks and if biopsy inconclusive, I will arrange bronchoscopy as an outpatient.  Pulmonary will sign off but please contact team if needed before then   Labs   CBC: Recent Labs  Lab 06/06/24 0953 06/07/24 0731  WBC 9.4 10.7*  NEUTROABS  --  8.6*  HGB 13.7 13.5  HCT 39.9 39.5  MCV 93.9 94.0  PLT 458* 485*    Basic Metabolic Panel: Recent Labs  Lab 06/06/24 1140 06/07/24 0731  NA 132* 134*  K 4.3 4.7  CL 97* 97*  CO2 24 24  GLUCOSE 110* 101*  BUN 9 12  CREATININE 0.80 0.62  CALCIUM 10.2 10.5*   GFR: Estimated Creatinine Clearance: 36.9 mL/min (by C-G formula based on SCr of 0.62 mg/dL). Recent Labs  Lab 06/06/24 0953 06/07/24 0731  WBC 9.4 10.7*     Liver Function Tests: No results for input(s): AST, ALT, ALKPHOS, BILITOT, PROT, ALBUMIN in the last 168 hours. No results for input(s): LIPASE, AMYLASE in the last 168 hours. No results for input(s): AMMONIA in the last 168 hours.  ABG No results found for: PHART, PCO2ART, PO2ART, HCO3, TCO2, ACIDBASEDEF, O2SAT   Coagulation Profile: No results for input(s): INR, PROTIME in the last 168 hours.  Cardiac Enzymes: No results for input(s): CKTOTAL, CKMB, CKMBINDEX, TROPONINI in the last 168 hours.  HbA1C: No results found for: HGBA1C  CBG: No results for input(s): GLUCAP in the last 168 hours.  Review of Systems:   As above  Past Medical History:  She,  has a past medical history of Acid reflux, COPD (chronic obstructive pulmonary disease) (HCC), and Hypercholesterolemia.   Surgical History:   Past Surgical History:  Procedure Laterality Date   APPENDECTOMY  1962   ruptured ovarian cyst   BREAST LUMPECTOMY     CHOLECYSTECTOMY     GALLBLADDER SURGERY     removed stones     Social History:   reports that she has quit smoking. She has never used smokeless tobacco. She reports that she does not currently use alcohol. She reports that she does not use drugs.   Family History:  Her family history includes Heart disease in her mother; Other in her father.   Allergies Allergies[1]   Home Medications  Prior to Admission medications  Medication Sig Start Date End Date Taking? Authorizing Provider  albuterol  (VENTOLIN  HFA) 108 (90 Base) MCG/ACT inhaler Inhale 2 puffs into the lungs every 4 (four) hours as needed for wheezing or shortness of breath. 06/06/24  Yes Ula Prentice Ullom, MD  albuterol  (VENTOLIN  HFA) 108 (90 Base) MCG/ACT inhaler Inhale 1 puff into the lungs every 6 (six) hours as needed for wheezing or shortness of breath.   Yes [provider]  ALPRAZolam  (XANAX ) 0.25 MG tablet Take 0.25 mg by mouth at bedtime.  Prn anxiety 07/11/16  Yes [provider]  ezetimibe  (ZETIA ) 10 MG tablet Take 10 mg by mouth at bedtime. 10/28/16  Yes [provider]  fenofibrate  160 MG tablet Take 160 mg by mouth daily. 07/03/22  Yes [provider]  magnesium  oxide (MAG-OX) 400 (240 Mg) MG tablet Take 400 mg by mouth daily. At night   Yes [provider]  Multiple Minerals (CALCIUM-MAGNESIUM -ZINC) TABS Take 1 tablet by mouth daily.   Yes [provider]  omeprazole (PRILOSEC) 20 MG capsule Take 1 capsule by mouth daily. 05/14/20  Yes [provider]  predniSONE  (DELTASONE ) 50 MG tablet Take one tablet by mouth daily 06/06/24  Yes Ula Prentice Beigel, MD  TRELEGY ELLIPTA 100-62.5-25 MCG/ACT AEPB Inhale 1 puff into the lungs daily.   Yes [provider]  VITAMIN E PO Take 1 tablet by mouth daily.   Yes [provider]     Critical care time: N/A    Care Time: 60 min  Slater Staff, M.D. [facility]monary/Critical Care [facility] 06/07/2024 5:46 PM   See Amion for personal pager For hours between 7 PM to 7 AM, please call Elink for urgent questions           [  1]  Allergies Allergen Reactions   Penicillins Hives, Rash and Swelling   Procaine Other (See Comments)    Mouth decay     Statins Other (See Comments)    Muscle aches Muscle aches    "

## 2024-06-08 ENCOUNTER — Inpatient Hospital Stay (HOSPITAL_COMMUNITY)

## 2024-06-08 DIAGNOSIS — R918 Other nonspecific abnormal finding of lung field: Secondary | ICD-10-CM | POA: Diagnosis not present

## 2024-06-08 DIAGNOSIS — J9620 Acute and chronic respiratory failure, unspecified whether with hypoxia or hypercapnia: Secondary | ICD-10-CM

## 2024-06-08 DIAGNOSIS — J9601 Acute respiratory failure with hypoxia: Secondary | ICD-10-CM | POA: Diagnosis not present

## 2024-06-08 DIAGNOSIS — R590 Localized enlarged lymph nodes: Secondary | ICD-10-CM | POA: Diagnosis not present

## 2024-06-08 DIAGNOSIS — J441 Chronic obstructive pulmonary disease with (acute) exacerbation: Secondary | ICD-10-CM

## 2024-06-08 LAB — BASIC METABOLIC PANEL WITH GFR
Anion gap: 12 (ref 5–15)
BUN: 11 mg/dL (ref 8–23)
CO2: 24 mmol/L (ref 22–32)
Calcium: 9.9 mg/dL (ref 8.9–10.3)
Chloride: 97 mmol/L — ABNORMAL LOW (ref 98–111)
Creatinine, Ser: 0.59 mg/dL (ref 0.44–1.00)
GFR, Estimated: >60 mL/min
Glucose, Bld: 144 mg/dL — ABNORMAL HIGH (ref 70–99)
Potassium: 4.3 mmol/L (ref 3.5–5.1)
Sodium: 132 mmol/L — ABNORMAL LOW (ref 135–145)

## 2024-06-08 LAB — PROTIME-INR
INR: 1.1 (ref 0.8–1.2)
Prothrombin Time: 14.6 s (ref 11.4–15.2)

## 2024-06-08 LAB — CBC
HCT: 37 % (ref 36.0–46.0)
Hemoglobin: 12.8 g/dL (ref 12.0–15.0)
MCH: 32.7 pg (ref 26.0–34.0)
MCHC: 34.6 g/dL (ref 30.0–36.0)
MCV: 94.4 fL (ref 80.0–100.0)
Platelets: 474 K/uL — ABNORMAL HIGH (ref 150–400)
RBC: 3.92 MIL/uL (ref 3.87–5.11)
RDW: 12.3 % (ref 11.5–15.5)
WBC: 9.5 K/uL (ref 4.0–10.5)
nRBC: 0 % (ref 0.0–0.2)

## 2024-06-08 MED ORDER — ALPRAZOLAM 0.5 MG PO TABS
0.5000 mg | ORAL_TABLET | Freq: Once | ORAL | Status: DC
  Filled 2024-06-08: qty 1

## 2024-06-08 MED ORDER — MIDAZOLAM HCL (PF) 2 MG/2ML IJ SOLN
0.5000 mg | Freq: Once | INTRAMUSCULAR | Status: AC
  Administered 2024-06-08: 0.5 mg via INTRAVENOUS

## 2024-06-08 MED ORDER — ALBUTEROL SULFATE (2.5 MG/3ML) 0.083% IN NEBU
2.5000 mg | INHALATION_SOLUTION | Freq: Two times a day (BID) | RESPIRATORY_TRACT | Status: DC
  Administered 2024-06-08 – 2024-06-10 (×4): 2.5 mg via RESPIRATORY_TRACT
  Filled 2024-06-08 (×4): qty 3

## 2024-06-08 MED ORDER — FENTANYL CITRATE (PF) 100 MCG/2ML IJ SOLN
INTRAMUSCULAR | Status: AC
  Filled 2024-06-08: qty 2

## 2024-06-08 MED ORDER — MIDAZOLAM HCL 2 MG/2ML IJ SOLN
INTRAMUSCULAR | Status: AC
  Filled 2024-06-08: qty 2

## 2024-06-08 MED ORDER — ONDANSETRON HCL 4 MG/2ML IJ SOLN: 4.0000 mg | Freq: Four times a day (QID) | INTRAMUSCULAR | Status: DC | PRN

## 2024-06-08 NOTE — Consult Note (Signed)
 "   Chief Complaint: Liver mass - IR consulted for image guided biopsy  Referring Provider(s): Claudene Maximino LABOR, MD   Supervising Physician: Hughes Simmonds  Patient Status: [facility] - In-pt  History of Present Illness: Cynthia Walton is a 84 y.o. female with pmhx of  COPD, hyperlipidemia, GERD, and prior tobacco abuse. She is currently admitted since 06/06/24 with worsening SOB, chest pain, and back pain. Also notes 12 lb weight loss in the last few months. CTA chest completed in the ED 06/06/24 shows spiculated 2.1 x 1.2 cm mass in the right upper lobe, and a 1.6 cm hypodensity in the left lobe of liver, concerning for metastatic disease. IR now consulted for image guided biopsy of the liver lesion.  Today patient with continued complaints of upper chest and back pain with movement, but not worse today than usual. No additional complaints.   Patient is Full Code  Past Medical History:  Diagnosis Date   Acid reflux    COPD (chronic obstructive pulmonary disease) (HCC)    Hypercholesterolemia     Past Surgical History:  Procedure Laterality Date   APPENDECTOMY  1962   ruptured ovarian cyst   BREAST LUMPECTOMY     CHOLECYSTECTOMY     GALLBLADDER SURGERY     removed stones    Allergies: Penicillins, Procaine, and Statins  Medications: Prior to Admission medications  Medication Sig Start Date End Date Taking? Authorizing Provider  albuterol  (VENTOLIN  HFA) 108 (90 Base) MCG/ACT inhaler Inhale 2 puffs into the lungs every 4 (four) hours as needed for wheezing or shortness of breath. 06/06/24  Yes Ula Prentice Borromeo, MD  albuterol  (VENTOLIN  HFA) 108 (90 Base) MCG/ACT inhaler Inhale 1 puff into the lungs every 6 (six) hours as needed for wheezing or shortness of breath.   Yes [provider]  ALPRAZolam  (XANAX ) 0.25 MG tablet Take 0.25 mg by mouth at bedtime. Prn anxiety 07/11/16  Yes [provider]  ezetimibe  (ZETIA ) 10 MG tablet Take 10 mg by mouth at bedtime. 10/28/16  Yes  [provider]  fenofibrate  160 MG tablet Take 160 mg by mouth daily. 07/03/22  Yes [provider]  magnesium  oxide (MAG-OX) 400 (240 Mg) MG tablet Take 400 mg by mouth daily. At night   Yes [provider]  Multiple Minerals (CALCIUM-MAGNESIUM -ZINC) TABS Take 1 tablet by mouth daily.   Yes [provider]  omeprazole (PRILOSEC) 20 MG capsule Take 1 capsule by mouth daily. 05/14/20  Yes [provider]  predniSONE  (DELTASONE ) 50 MG tablet Take one tablet by mouth daily 06/06/24  Yes Ula Prentice Kunzman, MD  TRELEGY ELLIPTA 100-62.5-25 MCG/ACT AEPB Inhale 1 puff into the lungs daily.   Yes [provider]  VITAMIN E PO Take 1 tablet by mouth daily.   Yes [provider]     Family History  Problem Relation Age of Onset   Heart disease Mother    Other Father        old age    Social History   Socioeconomic History   Marital status: Widowed    Spouse name: Not on file   Number of children: 1   Years of education: Not on file   Highest education level: High school graduate  Occupational History    Comment: retired  Tobacco Use   Smoking status: Former   Smokeless tobacco: Never   Tobacco comments:    quit many years ago  Substance and Sexual Activity   Alcohol use: Not  Currently   Drug use: Never   Sexual activity: Not on file  Other Topics Concern   Not on file  Social History Narrative   Lives alone   Social Drivers of Health   Tobacco Use: Medium Risk (06/06/2024)   Patient History    Smoking Tobacco Use: Former    Smokeless Tobacco Use: Never    Passive Exposure: Not on Actuary Strain: Not on file  Food Insecurity: No Food Insecurity (06/07/2024)   Epic    Worried About Programme Researcher, Broadcasting/film/video in the Last Year: Never true    Ran Out of Food in the Last Year: Never true  Transportation Needs: No Transportation Needs (06/07/2024)   Epic    Lack of Transportation (Medical): No    Lack of  Transportation (Non-Medical): No  Physical Activity: Not on file  Stress: Not on file  Social Connections: Not on file  Depression (EYV7-0): Not on file  Alcohol Screen: Not on file  Housing: Low Risk (06/07/2024)   Epic    Unable to Pay for Housing in the Last Year: No    Number of Times Moved in the Last Year: 0    Homeless in the Last Year: No  Utilities: Not At Risk (06/07/2024)   Epic    Threatened with loss of utilities: No  Health Literacy: Not on file     Review of Systems: A 12 point ROS discussed and pertinent positives are indicated in the HPI above.  All other systems are negative.  Vital Signs: BP (!) 151/74 (BP Location: Left Arm)   Pulse 95   Temp 97.8 F (36.6 C) (Oral)   Resp 17   Ht 4' 11 (1.499 m)   Wt 95 lb (43.1 kg)   SpO2 95%   BMI 19.19 kg/m   Advance Care Plan: No documents on file  Physical Exam Vitals and nursing note reviewed.  Constitutional:      Appearance: Normal appearance.  HENT:     Mouth/Throat:     Mouth: Mucous membranes are moist.     Pharynx: Oropharynx is clear.  Cardiovascular:     Rate and Rhythm: Normal rate.  Pulmonary:     Effort: Pulmonary effort is normal.     Breath sounds: Normal breath sounds.  Abdominal:     Palpations: Abdomen is soft.     Tenderness: There is no abdominal tenderness.  Musculoskeletal:     Right lower leg: No edema.     Left lower leg: No edema.  Skin:    General: Skin is warm and dry.  Neurological:     Mental Status: She is alert and oriented to person, place, and time. Mental status is at baseline.     Imaging: CT HEAD W & WO CONTRAST ( ) Result Date: 06/08/2024 CLINICAL DATA:  Initial evaluation for metastatic disease. EXAM: CT HEAD WITHOUT AND WITH CONTRAST TECHNIQUE: Contiguous axial images were obtained from the base of the skull through the vertex without and with intravenous contrast. RADIATION DOSE REDUCTION: This exam was performed according to the departmental  dose-optimization program which includes automated exposure control, adjustment of the mA and/or kV according to patient size and/or use of iterative reconstruction technique. CONTRAST:  75mL OMNIPAQUE  IOHEXOL  350 MG/ML SOLN COMPARISON:  CT from 07/16/2020. FINDINGS: Brain: Generalized age-related cerebral atrophy. Patchy and confluent hypodensity involving the supratentorial cerebral white matter, consistent with chronic small vessel ischemic disease, advanced in nature. 1 cm hypodensity at the anterior limb of  the left internal capsule (series 9, image 18), age indeterminate. No other acute large vessel territory infarct. No acute intracranial hemorrhage. No mass lesion, mass effect, or midline shift. No abnormal enhancement seen following contrast administration. No evidence for intracranial metastatic disease. No hydrocephalus or extra-axial fluid collection. Vascular: No abnormal hyperdense vessel seen prior to contrast administration. Calcified atherosclerosis present at skull base. Following contrast administration shin, normal intravascular enhancement seen throughout the major intracranial vascular structures. Skull: Scalp soft tissues demonstrate no acute finding. Calvarium intact. No focal osseous lesions. Sinuses/Orbits: Globes and orbital soft tissues within normal limits. Ocular senescent calcifications noted. Paranasal sinuses are largely clear. No significant mastoid effusion. Other: None. IMPRESSION: 1. No evidence for intracranial metastatic disease. 2. 1 cm hypodensity at the anterior limb of the left internal capsule, age indeterminate. While this finding could be chronic in nature, a possible small acute to subacute ischemic infarct could also potentially have this appearance. Correlation with dedicated MRI could be performed for further evaluation as warranted. 3. Underlying age-related cerebral atrophy with advanced chronic small vessel ischemic disease. Electronically Signed   By: Morene Hoard M.D.   On: 06/08/2024 03:37   CT ABDOMEN PELVIS WO CONTRAST Result Date: 06/07/2024 EXAM: CT ABDOMEN AND PELVIS WITH CONTRAST 06/07/2024 06:11:38 PM TECHNIQUE: CT of the abdomen and pelvis was performed with the administration of intravenous contrast. Multiplanar reformatted images are provided for review. Automated exposure control, iterative reconstruction, and/or weight-based adjustment of the mA/kV was utilized to reduce the radiation dose to as low as reasonably achievable. COMPARISON: CT chest 06/06/2024 and report from abdominal ultrasound dated 03/06/2016 (images not available). CLINICAL HISTORY: Cancer staging. Thoracic adenopathy with right upper lobe irregular nodule suspicious for malignancy, for further staging assessment. * Tracking Code: BO * FINDINGS: LOWER CHEST: Descending thoracic aortic atherosclerosis. Right coronary artery atherosclerosis. Emphysema at the lung bases. LIVER: 1.6 x 1.3 cm hypodense lesion in the lateral segment left hepatic lobe on image 14 series 2, internal density of 7.3 compatible with a cyst; also a 1.6 cm simple cyst was described in the left hepatic lobe on the ultrasound of 03/06/2016. The appearance was more ambiguous on the contrast enhanced CT chest from 06/06/2024 with a somewhat heterogeneous appearance, but this was probably due to motion artifact. A similar 1.1 cm lesion in the right hepatic lobe on image 17 of series 12 has a noncontrast CT density of 5 Hounsfield units on image 55 series 6, favoring a benign cyst. GALLBLADDER AND BILE DUCTS: Gallbladder is unremarkable. No biliary ductal dilatation. SPLEEN: No acute abnormality. PANCREAS: No acute abnormality. ADRENAL GLANDS: No acute abnormality. KIDNEYS, URETERS AND BLADDER: 7.6 x 5.6 cm homogeneous fluid density lesion of the left kidney upper pole, favoring benign cyst and not requiring further workup. Nonspecific 0.9 cm hypodense lesion of the right mid to lower kidney medially on image 30  series 2, nonspecific. High density throughout the urinary bladder compatible with excreted contrast medium. No stones in the kidneys or ureters. No hydronephrosis. No perinephric or periureteral stranding. GI AND BOWEL: Stomach demonstrates no acute abnormality. Suspected distal jejunal diverticulum on image 26 series 2 with internal air fluid level. Sigmoid colon diverticulosis. There is no bowel obstruction. PERITONEUM AND RETROPERITONEUM: No ascites. No free air. VASCULATURE: Aorta is normal in caliber. Atheromatous plaque at the origins of the celiac trunk and superior mesenteric artery. LYMPH NODES: No lymphadenopathy. REPRODUCTIVE ORGANS: No acute abnormality. BONES AND SOFT TISSUES: Sacralized L5 vertebral body. Mild grade 1 degenerative anterolisthesis at  L4-L5 with L4 superior endplate concavity suggesting remote mild compression fracture. No focal soft tissue abnormality. IMPRESSION: 1. No findings of active malignancy in the abdomen/pelvis on today's noncontrast CT scan. 2. Mild grade 1 degenerative anterolisthesis at L4-5 with L4 superior endplate concavity suggesting remote mild compression fracture. 3. Descending thoracic aortic atherosclerosis. 4. Right coronary artery atherosclerosis. 5. Emphysema at the lung bases. 6. Sacralized L5 vertebral body. 7. Atheromatous plaque at the origins of the celiac trunk and superior mesenteric artery. 8. Sigmoid colon diverticulosis. Electronically signed by: Ryan Salvage MD 06/07/2024 06:31 PM EST RP Workstation: [id number]   CT Angio Chest PE W and/or Wo Contrast Result Date: 06/06/2024 CLINICAL DATA:  Chest pain radiating to back for 2 weeks, hypoxia today EXAM: CT ANGIOGRAPHY CHEST WITH CONTRAST TECHNIQUE: Multidetector CT imaging of the chest was performed using the standard protocol during bolus administration of intravenous contrast. Multiplanar CT image reconstructions and MIPs were obtained to evaluate the vascular anatomy. RADIATION DOSE  REDUCTION: This exam was performed according to the departmental dose-optimization program which includes automated exposure control, adjustment of the mA and/or kV according to patient size and/or use of iterative reconstruction technique. CONTRAST:  75mL OMNIPAQUE  IOHEXOL  350 MG/ML SOLN COMPARISON:  06/06/2024 FINDINGS: Cardiovascular: This is a technically adequate evaluation of the pulmonary vasculature. No filling defects or pulmonary emboli. The heart is unremarkable without pericardial effusion. No evidence of thoracic aortic aneurysm or dissection. Atherosclerosis of the aorta and coronary vasculature. Mediastinum/Nodes: There is mediastinal and right hilar adenopathy. Right paratracheal adenopathy measures up to 13 mm in short axis, reference image 24/302. Right hilar adenopathy measures up to 21 mm in short axis reference image 50/302. There is no left hilar adenopathy. Thyroid, trachea, and esophagus are unremarkable. Lungs/Pleura: Severe emphysema. There is a spiculated pleural based right upper lobe mass measuring 2.1 x 1.2 cm reference image 47/303, highly concerning for malignancy given the associated mediastinal and right hilar adenopathy. No acute airspace disease, effusion, or pneumothorax. Central airways are patent. Upper Abdomen: Indeterminate 1.6 cm hypodensity within the ventral left lobe liver image 106/302, concerning for metastatic disease in light of intrathoracic findings. Additional indeterminate hypodensity image 113/302 within the right lobe measuring 1.1 cm is more consistent with a hepatic cyst. There is an 8.0 x 5.9 x 5.5 cm soft tissue mass within the left suprarenal fossa, measuring up to 13 HU. It is difficult to tell whether this is related to the left adrenal gland or arises from the upper pole the left kidney. Dedicated abdominal CT with IV contrast may be useful. Musculoskeletal: No acute or destructive bony abnormalities. Reconstructed images demonstrate no additional  findings. Review of the MIP images confirms the above findings. IMPRESSION: 1. No evidence of pulmonary embolus. 2. Spiculated subpleural right upper lobe mass measuring 2.1 x 1.2 cm, concerning for bronchogenic malignancy. PET scan may be useful. 3. Pathologic right paratracheal and right hilar adenopathy, concerning for nodal metastases. 4. Indeterminate left lobe liver hypodensity, also concerning for metastatic disease. 5. Soft tissue mass in the left suprarenal fossa, which may be of adrenal or renal origin. Further imaging with dedicated abdominal and pelvic CT with IV and oral contrast may be useful. 6. Aortic Atherosclerosis (ICD10-I70.0) and Emphysema (ICD10-J43.9). Electronically Signed   By: Ozell Daring M.D.   On: 06/06/2024 15:25   DG Chest 2 View Result Date: 06/06/2024 EXAM: 2 VIEW(S) XRAY OF THE CHEST 06/06/2024 10:23:00 AM COMPARISON: None available. CLINICAL HISTORY: chest /back pain FINDINGS: LUNGS AND PLEURA: Hyperinflation consistent  with COPD. Upper lobe linear scarring. No pleural effusion. No pneumothorax. HEART AND MEDIASTINUM: Aortic arch calcifications. BONES AND SOFT TISSUES: No acute osseous abnormality. Osteopenia. Multilevel degenerative changes of the spine. IMPRESSION: 1. Emphysema. No acute cardiopulmonary abnormality. Electronically signed by: Rogelia Myers MD MD 06/06/2024 12:21 PM EST RP Workstation: [id number]    Labs:  CBC: Recent Labs    06/06/24 0953 06/07/24 0731 06/08/24 0241  WBC 9.4 10.7* 9.5  HGB 13.7 13.5 12.8  HCT 39.9 39.5 37.0  PLT 458* 485* 474*    COAGS: Recent Labs    06/08/24 0241  INR 1.1    BMP: Recent Labs    06/06/24 1140 06/07/24 0731 06/08/24 0241  NA 132* 134* 132*  K 4.3 4.7 4.3  CL 97* 97* 97*  CO2 24 24 24   GLUCOSE 110* 101* 144*  BUN 9 12 11   CALCIUM 10.2 10.5* 9.9  CREATININE 0.80 0.62 0.59  GFRNONAA >60 >60 >60    LIVER FUNCTION TESTS: No results for input(s): BILITOT, AST, ALT, ALKPHOS, PROT,  ALBUMIN in the last 8760 hours.  TUMOR MARKERS: No results for input(s): AFPTM, CEA, CA199, CHROMGRNA in the last 8760 hours.  Assessment and Plan:  Cynthia Walton is a 84 y.o. female with pmhx of  COPD, hyperlipidemia, GERD, and prior tobacco abuse. She is currently admitted since 06/06/24 with worsening SOB, chest pain, and back pain. Also notes 12 lb weight loss in the last few months. CTA chest completed in the ED 06/06/24 shows spiculated 2.1 x 1.2 cm mass in the right upper lobe, and a 1.6 cm hypodensity in the left lobe of liver, concerning for metastatic disease. IR now consulted for image guided biopsy of the liver lesion.  Today patient with continued complaints of upper chest and back pain with movement, but not worse today than usual. No additional complaints.  IR planning liver lesion biopsy - Pt has been NPO - labs wnl - no anticoagulation  Risks and benefits of liver lesion biopsy was discussed with the patient and/or patient's family including, but not limited to bleeding, infection, damage to adjacent structures or low yield requiring additional tests.  All of the questions were answered and there is agreement to proceed.  Consent signed and in chart.   Thank you for allowing our service to participate in TIONA RUANE 's care.  Electronically Signed: Kimble VEAR Clas, PA-C   06/08/2024, 9:45 AM      I spent a total of 20 Minutes    in face to face in clinical consultation, greater than 50% of which was counseling/coordinating care for liver lesion biopsy  "

## 2024-06-08 NOTE — Plan of Care (Signed)
  Problem: Clinical Measurements: Goal: Will remain free from infection Outcome: Progressing   Problem: Nutrition: Goal: Adequate nutrition will be maintained Outcome: Progressing   Problem: Coping: Goal: Level of anxiety will decrease Outcome: Progressing   Problem: Elimination: Goal: Will not experience complications related to urinary retention Outcome: Progressing   Problem: Safety: Goal: Ability to remain free from injury will improve Outcome: Progressing

## 2024-06-08 NOTE — Progress Notes (Signed)
 Upon investigation, per, MD Mugweru; no mass to biopsy. No procedure required.Bedside report given to 2c RN

## 2024-06-08 NOTE — Progress Notes (Signed)
 " PROGRESS NOTE    Cynthia Walton  FMW:[id number] DOB: 06-18-1940 DOA: 06/06/2024 PCP: Alray Loader, MD  84 y.o. female with medical history significant of COPD, hyperlipidemia, GERD, and prior tobacco abuse presented with progressively worsening shortness of breath and chest pain. She experiences progressively worsening shortness of breath, particularly noticeable at night. She does not use oxygen therapy. She has a significant history of smoking, having quit 20 years ago after smoking for at least 30 years. She has pain primarily in the upper chest and back, with the back being the most painful area. The pain is exacerbated by straining and movements. In the ED noted to be hypoxic, O2 saturations in the 80s on room air with improvement on 1.5 L of nasal cannula oxygen O2 96%.  Labs 1/12 noted WBC 9.4, platelets 458, sodium 132, chloride 97, glucose 110, and high-sensitivity troponin 15.  Chest x-ray noted emphysema without acute abnormality.  CTA chest noted right upper lobe mass measuring 2.1 X1 0.2 cm with paratracheal and hilar adenopathy, indeterminant liver lobe hypodensity noted as well . - Seen by pulmonary, recommended liver biopsy per IR   Subjective: -Feels fair, n.p.o. for liver biopsy today  Assessment and Plan:  Acute respiratory failure with hypoxia COPD - Noted to be hypoxic in the ER, suspect this is chronic respiratory failure, advanced emphysema on imaging  - Continue Breztri , albuterol  prednisone  taper for now  - Check ambulatory O2 sats prior to DC   Lung mass  Suspected metastatic disease - CT showing a 2.1 x 1.2 cm spiculated right upper lobe mass with pathologic right paratracheal and hilar adenopathy, indeterminate left liver hypodensity, and left pararenal soft tissue mass.  Question possible bronchogenic carcinoma.  Risk factors include significant history of tobacco abuse.  Imaging -- Appreciate IR consulted for liver biopsy today - Appreciate pulmonary input    Leukocytosis Resolved, likely reactive   Chest pain Abnormal EKG Likely secondary to above, reports chest pain wrapping around to her back intermittently, troponin negative, CTA chest as noted above   Hyponatremia -Likely secondary to above, mild to monitor   Hypercalcemia Rib solved   Mild cognitive impairment Family makes note the patient has some issues with her memory. - Delirium precautions  Hyperlipidemia - Continue Zetia  and fenofibrate     Anxiety/insomnia - Continue Xanax  as needed.     GERD - Continue omeprazole   DVT prophylaxis: SCDs  Advance Care Planning:   Code Status: Full Code     Consults: Pulmonology, interventional radiology   Family Communication: Patient's sister updated at bedside Disposition Plan: Home pending above workup  Consultants:    Procedures:   Antimicrobials:    Objective: Vitals:   06/08/24 0731 06/08/24 0833 06/08/24 1100 06/08/24 1130  BP: (!) 151/74  131/74 (!) 140/72  Pulse: 95  96 (!) 103  Resp: 17 17 16 18   Temp: 97.8 F (36.6 C)  98.5 F (36.9 C)   TempSrc: Oral  Oral   SpO2: 95%  92% 96%  Weight:      Height:        Intake/Output Summary (Last 24 hours) at 06/08/2024 1325 Last data filed at 06/08/2024 0733 Gross per 24 hour  Intake 240 ml  Output 800 ml  Net -560 ml   Filed Weights   06/06/24 0950 06/07/24 1409  Weight: 46.3 kg 43.1 kg    Examination:  General exam: Appears calm and comfortable  Respiratory system: Poor air movement bilaterally Cardiovascular system: S1 & S2 heard,  RRR.  Abd: nondistended, soft and nontender.Normal bowel sounds heard. Central nervous system: Alert and oriented. No focal neurological deficits. Extremities: no edema Skin: No rashes Psychiatry:  Mood & affect appropriate.     Data Reviewed:   CBC: Recent Labs  Lab 06/06/24 0953 06/07/24 0731 06/08/24 0241  WBC 9.4 10.7* 9.5  NEUTROABS  --  8.6*  --   HGB 13.7 13.5 12.8  HCT 39.9 39.5 37.0  MCV 93.9  94.0 94.4  PLT 458* 485* 474*   Basic Metabolic Panel: Recent Labs  Lab 06/06/24 1140 06/07/24 0731 06/08/24 0241  NA 132* 134* 132*  K 4.3 4.7 4.3  CL 97* 97* 97*  CO2 24 24 24   GLUCOSE 110* 101* 144*  BUN 9 12 11   CREATININE 0.80 0.62 0.59  CALCIUM 10.2 10.5* 9.9   GFR: Estimated Creatinine Clearance: 36.9 mL/min (by C-G formula based on SCr of 0.59 mg/dL). Liver Function Tests: No results for input(s): AST, ALT, ALKPHOS, BILITOT, PROT, ALBUMIN in the last 168 hours. No results for input(s): LIPASE, AMYLASE in the last 168 hours. No results for input(s): AMMONIA in the last 168 hours. Coagulation Profile: Recent Labs  Lab 06/08/24 0241  INR 1.1   Cardiac Enzymes: No results for input(s): CKTOTAL, CKMB, CKMBINDEX, TROPONINI in the last 168 hours. BNP (last 3 results) No results for input(s): PROBNP in the last 8760 hours. HbA1C: No results for input(s): HGBA1C in the last 72 hours. CBG: No results for input(s): GLUCAP in the last 168 hours. Lipid Profile: No results for input(s): CHOL, HDL, LDLCALC, TRIG, CHOLHDL, LDLDIRECT in the last 72 hours. Thyroid Function Tests: Recent Labs    06/07/24 2105  TSH 1.010   Anemia Panel: No results for input(s): VITAMINB12, FOLATE, FERRITIN, TIBC, IRON, RETICCTPCT in the last 72 hours. Urine analysis: No results found for: COLORURINE, APPEARANCEUR, LABSPEC, PHURINE, GLUCOSEU, HGBUR, BILIRUBINUR, KETONESUR, PROTEINUR, UROBILINOGEN, NITRITE, LEUKOCYTESUR Sepsis Labs: @LABRCNTIP (procalcitonin:4,lacticidven:4)  ) Recent Results (from the past 240 hours)  MRSA Next Gen by PCR, Nasal     Status: None   Collection Time: 06/07/24  2:12 PM   Specimen: Nasal Mucosa; Nasal Swab  Result Value Ref Range Status   MRSA by PCR Next Gen NOT DETECTED NOT DETECTED Final    Comment: (NOTE) The GeneXpert MRSA Assay (FDA approved for NASAL specimens only), is  one component of a comprehensive MRSA colonization surveillance program. It is not intended to diagnose MRSA infection nor to guide or monitor treatment for MRSA infections. Test performance is not FDA approved in patients less than 68 years old. Performed at [facility], [address] [place], [place] [address]   Resp panel by RT-PCR (RSV, Flu A&B, Covid) Anterior Nasal Swab     Status: None   Collection Time: 06/07/24  5:15 PM   Specimen: Anterior Nasal Swab  Result Value Ref Range Status   SARS Coronavirus 2 by RT PCR NEGATIVE NEGATIVE Final   Influenza A by PCR NEGATIVE NEGATIVE Final   Influenza B by PCR NEGATIVE NEGATIVE Final    Comment: (NOTE) The Xpert Xpress SARS-CoV-2/FLU/RSV plus assay is intended as an aid in the diagnosis of influenza from Nasopharyngeal swab specimens and should not be used as a sole basis for treatment. Nasal washings and aspirates are unacceptable for Xpert Xpress SARS-CoV-2/FLU/RSV testing.  Fact Sheet for Patients: bloggercourse.com  Fact Sheet for Healthcare Providers: seriousbroker.it  This test is not yet approved or cleared by the [place]  FDA and has been  authorized for detection and/or diagnosis of SARS-CoV-2 by FDA under an Emergency Use Authorization (EUA). This EUA will remain in effect (meaning this test can be used) for the duration of the COVID-19 declaration under Section 564(b)(1) of the Act, 21 U.S.C. section 360bbb-3(b)(1), unless the authorization is terminated or revoked.     Resp Syncytial Virus by PCR NEGATIVE NEGATIVE Final    Comment: (NOTE) Fact Sheet for Patients: bloggercourse.com  Fact Sheet for Healthcare Providers: seriousbroker.it  This test is not yet approved or cleared by the [place]  FDA and has been authorized for detection and/or diagnosis of SARS-CoV-2 by FDA under an Emergency Use  Authorization (EUA). This EUA will remain in effect (meaning this test can be used) for the duration of the COVID-19 declaration under Section 564(b)(1) of the Act, 21 U.S.C. section 360bbb-3(b)(1), unless the authorization is terminated or revoked.  Performed at [facility], [address], [place], [place] [address]      Radiology Studies: CT HEAD W & WO CONTRAST ( ) Result Date: 06/08/2024 CLINICAL DATA:  Initial evaluation for metastatic disease. EXAM: CT HEAD WITHOUT AND WITH CONTRAST TECHNIQUE: Contiguous axial images were obtained from the base of the skull through the vertex without and with intravenous contrast. RADIATION DOSE REDUCTION: This exam was performed according to the departmental dose-optimization program which includes automated exposure control, adjustment of the mA and/or kV according to patient size and/or use of iterative reconstruction technique. CONTRAST:  75mL OMNIPAQUE  IOHEXOL  350 MG/ML SOLN COMPARISON:  CT from 07/16/2020. FINDINGS: Brain: Generalized age-related cerebral atrophy. Patchy and confluent hypodensity involving the supratentorial cerebral white matter, consistent with chronic small vessel ischemic disease, advanced in nature. 1 cm hypodensity at the anterior limb of the left internal capsule (series 9, image 18), age indeterminate. No other acute large vessel territory infarct. No acute intracranial hemorrhage. No mass lesion, mass effect, or midline shift. No abnormal enhancement seen following contrast administration. No evidence for intracranial metastatic disease. No hydrocephalus or extra-axial fluid collection. Vascular: No abnormal hyperdense vessel seen prior to contrast administration. Calcified atherosclerosis present at skull base. Following contrast administration shin, normal intravascular enhancement seen throughout the major intracranial vascular structures. Skull: Scalp soft tissues demonstrate no acute finding. Calvarium intact. No  focal osseous lesions. Sinuses/Orbits: Globes and orbital soft tissues within normal limits. Ocular senescent calcifications noted. Paranasal sinuses are largely clear. No significant mastoid effusion. Other: None. IMPRESSION: 1. No evidence for intracranial metastatic disease. 2. 1 cm hypodensity at the anterior limb of the left internal capsule, age indeterminate. While this finding could be chronic in nature, a possible small acute to subacute ischemic infarct could also potentially have this appearance. Correlation with dedicated MRI could be performed for further evaluation as warranted. 3. Underlying age-related cerebral atrophy with advanced chronic small vessel ischemic disease. Electronically Signed   By: Morene Hoard M.D.   On: 06/08/2024 03:37   CT ABDOMEN PELVIS WO CONTRAST Result Date: 06/07/2024 EXAM: CT ABDOMEN AND PELVIS WITH CONTRAST 06/07/2024 06:11:38 PM TECHNIQUE: CT of the abdomen and pelvis was performed with the administration of intravenous contrast. Multiplanar reformatted images are provided for review. Automated exposure control, iterative reconstruction, and/or weight-based adjustment of the mA/kV was utilized to reduce the radiation dose to as low as reasonably achievable. COMPARISON: CT chest 06/06/2024 and report from abdominal ultrasound dated 03/06/2016 (images not available). CLINICAL HISTORY: Cancer staging. Thoracic adenopathy with right upper lobe irregular nodule suspicious for malignancy, for further staging assessment. * Tracking Code: BO *  FINDINGS: LOWER CHEST: Descending thoracic aortic atherosclerosis. Right coronary artery atherosclerosis. Emphysema at the lung bases. LIVER: 1.6 x 1.3 cm hypodense lesion in the lateral segment left hepatic lobe on image 14 series 2, internal density of 7.3 compatible with a cyst; also a 1.6 cm simple cyst was described in the left hepatic lobe on the ultrasound of 03/06/2016. The appearance was more ambiguous on the contrast  enhanced CT chest from 06/06/2024 with a somewhat heterogeneous appearance, but this was probably due to motion artifact. A similar 1.1 cm lesion in the right hepatic lobe on image 17 of series 12 has a noncontrast CT density of 5 Hounsfield units on image 55 series 6, favoring a benign cyst. GALLBLADDER AND BILE DUCTS: Gallbladder is unremarkable. No biliary ductal dilatation. SPLEEN: No acute abnormality. PANCREAS: No acute abnormality. ADRENAL GLANDS: No acute abnormality. KIDNEYS, URETERS AND BLADDER: 7.6 x 5.6 cm homogeneous fluid density lesion of the left kidney upper pole, favoring benign cyst and not requiring further workup. Nonspecific 0.9 cm hypodense lesion of the right mid to lower kidney medially on image 30 series 2, nonspecific. High density throughout the urinary bladder compatible with excreted contrast medium. No stones in the kidneys or ureters. No hydronephrosis. No perinephric or periureteral stranding. GI AND BOWEL: Stomach demonstrates no acute abnormality. Suspected distal jejunal diverticulum on image 26 series 2 with internal air fluid level. Sigmoid colon diverticulosis. There is no bowel obstruction. PERITONEUM AND RETROPERITONEUM: No ascites. No free air. VASCULATURE: Aorta is normal in caliber. Atheromatous plaque at the origins of the celiac trunk and superior mesenteric artery. LYMPH NODES: No lymphadenopathy. REPRODUCTIVE ORGANS: No acute abnormality. BONES AND SOFT TISSUES: Sacralized L5 vertebral body. Mild grade 1 degenerative anterolisthesis at L4-L5 with L4 superior endplate concavity suggesting remote mild compression fracture. No focal soft tissue abnormality. IMPRESSION: 1. No findings of active malignancy in the abdomen/pelvis on today's noncontrast CT scan. 2. Mild grade 1 degenerative anterolisthesis at L4-5 with L4 superior endplate concavity suggesting remote mild compression fracture. 3. Descending thoracic aortic atherosclerosis. 4. Right coronary artery  atherosclerosis. 5. Emphysema at the lung bases. 6. Sacralized L5 vertebral body. 7. Atheromatous plaque at the origins of the celiac trunk and superior mesenteric artery. 8. Sigmoid colon diverticulosis. Electronically signed by: Ryan Salvage MD 06/07/2024 06:31 PM EST RP Workstation: [id number]   CT Angio Chest PE W and/or Wo Contrast Result Date: 06/06/2024 CLINICAL DATA:  Chest pain radiating to back for 2 weeks, hypoxia today EXAM: CT ANGIOGRAPHY CHEST WITH CONTRAST TECHNIQUE: Multidetector CT imaging of the chest was performed using the standard protocol during bolus administration of intravenous contrast. Multiplanar CT image reconstructions and MIPs were obtained to evaluate the vascular anatomy. RADIATION DOSE REDUCTION: This exam was performed according to the departmental dose-optimization program which includes automated exposure control, adjustment of the mA and/or kV according to patient size and/or use of iterative reconstruction technique. CONTRAST:  75mL OMNIPAQUE  IOHEXOL  350 MG/ML SOLN COMPARISON:  06/06/2024 FINDINGS: Cardiovascular: This is a technically adequate evaluation of the pulmonary vasculature. No filling defects or pulmonary emboli. The heart is unremarkable without pericardial effusion. No evidence of thoracic aortic aneurysm or dissection. Atherosclerosis of the aorta and coronary vasculature. Mediastinum/Nodes: There is mediastinal and right hilar adenopathy. Right paratracheal adenopathy measures up to 13 mm in short axis, reference image 24/302. Right hilar adenopathy measures up to 21 mm in short axis reference image 50/302. There is no left hilar adenopathy. Thyroid, trachea, and esophagus are unremarkable. Lungs/Pleura: Severe emphysema. There is  a spiculated pleural based right upper lobe mass measuring 2.1 x 1.2 cm reference image 47/303, highly concerning for malignancy given the associated mediastinal and right hilar adenopathy. No acute airspace disease, effusion,  or pneumothorax. Central airways are patent. Upper Abdomen: Indeterminate 1.6 cm hypodensity within the ventral left lobe liver image 106/302, concerning for metastatic disease in light of intrathoracic findings. Additional indeterminate hypodensity image 113/302 within the right lobe measuring 1.1 cm is more consistent with a hepatic cyst. There is an 8.0 x 5.9 x 5.5 cm soft tissue mass within the left suprarenal fossa, measuring up to 13 HU. It is difficult to tell whether this is related to the left adrenal gland or arises from the upper pole the left kidney. Dedicated abdominal CT with IV contrast may be useful. Musculoskeletal: No acute or destructive bony abnormalities. Reconstructed images demonstrate no additional findings. Review of the MIP images confirms the above findings. IMPRESSION: 1. No evidence of pulmonary embolus. 2. Spiculated subpleural right upper lobe mass measuring 2.1 x 1.2 cm, concerning for bronchogenic malignancy. PET scan may be useful. 3. Pathologic right paratracheal and right hilar adenopathy, concerning for nodal metastases. 4. Indeterminate left lobe liver hypodensity, also concerning for metastatic disease. 5. Soft tissue mass in the left suprarenal fossa, which may be of adrenal or renal origin. Further imaging with dedicated abdominal and pelvic CT with IV and oral contrast may be useful. 6. Aortic Atherosclerosis (ICD10-I70.0) and Emphysema (ICD10-J43.9). Electronically Signed   By: Ozell Daring M.D.   On: 06/06/2024 15:25     Scheduled Meds:  albuterol   2.5 mg Nebulization BID   ALPRAZolam   0.25 mg Oral QHS   ALPRAZolam   0.5 mg Oral Once   budesonide -glycopyrrolate -formoterol   2 puff Inhalation BID   ezetimibe   10 mg Oral QHS   fenofibrate   160 mg Oral Daily   guaiFENesin   600 mg Oral BID   magnesium  oxide  400 mg Oral QHS   melatonin  3 mg Oral QHS   pantoprazole   40 mg Oral Daily   predniSONE   40 mg Oral Q breakfast   sodium chloride  flush  3 mL Intravenous  Q12H   Continuous Infusions:   LOS: 1 day    Time spent:    Sigurd Pac, MD [facility]   06/08/2024, 1:25 PM    "

## 2024-06-08 NOTE — Procedures (Signed)
 Vascular and Interventional Radiology Procedure Note  Patient: [name] DOB: 1940/08/30 Medical Record Number: [id number] Note Date/Time: 06/08/2024 12:16 PM   Performing Physician: Thom Hall, MD Assistant(s): None  Diagnosis: New Dx R lung mass. Q liver metastases  Procedure: *Aborted* LIVER MASS BIOPSY  Anesthesia: None Complications: None Estimated Blood Loss: 0 mL Specimens: Sent for None  Findings:  Noted hypodense liver masses on CT are consistent w hepatic cysts Aborted Ultrasound-guided biopsy of liver mass.   See detailed procedure note with images in PACS. The patient tolerated the procedure well without incident or complication and was returned to Recovery in stable condition.    Thom Hall, MD Vascular and Interventional Radiology Specialists [facility] Radiology   Pager. [phone] Clinic. [phone]

## 2024-06-08 NOTE — Plan of Care (Signed)
  Problem: Education: Goal: Knowledge of General Education information will improve Description: Including pain rating scale, medication(s)/side effects and non-pharmacologic comfort measures Outcome: Progressing   Problem: Coping: Goal: Level of anxiety will decrease Outcome: Progressing   Problem: Elimination: Goal: Will not experience complications related to bowel motility Outcome: Progressing Goal: Will not experience complications related to urinary retention Outcome: Progressing   Problem: Pain Managment: Goal: General experience of comfort will improve and/or be controlled Outcome: Progressing   Problem: Safety: Goal: Ability to remain free from injury will improve Outcome: Progressing   Problem: Skin Integrity: Goal: Risk for impaired skin integrity will decrease Outcome: Progressing

## 2024-06-08 NOTE — Telephone Encounter (Signed)
 Scheduled HFU with Candis for 1/28.

## 2024-06-08 NOTE — Progress Notes (Signed)
 Patient told both the unit manager and RN that she decided to no longer go through with the procedure tomorrow due to it not being worth the risk at her age. MD made aware.

## 2024-06-08 NOTE — Consult Note (Addendum)
 "  NAME:  Cynthia Walton, MRN:  [id number], DOB:  1940/12/14, LOS: 1 ADMISSION DATE:  06/06/2024, CONSULTATION DATE:  06/07/24 REFERRING MD:  Maximino Sharps, MD CHIEF COMPLAINT:  Right lung mass   History of Present Illness:  84 year old female former smoker >30 years with severe emphysema, HLD, GERD who presented with shortness of breath and chest pain. Endorses weight loss 12 lbs in the last 3 months. Denies cough or wheezing. In the ED vitals significant for hypoxemia to 80s and started on 1-2L O2.  PCCM consulted for CTA neg for PE but significant for spiculated right upper lobe mass 2.1 x 1.2 cm with background severe emphysema. Mediastinal enlargement also seen including right paratracheal and right hilar adenopathy. Left lobe liver hypodensity also seen. IR planning for liver biopsy. PCCM consulted to discuss alternative options.    Pertinent  Medical History  As above  Significant Hospital Events: Including procedures, antibiotic start and stop dates in addition to other pertinent events     Interim History / Subjective:  Family discussion with patient and son as noted below Unable to obtain hepatic biopsy. Cysts on ultrasound  Objective    Blood pressure (!) 149/73, pulse (!) 101, temperature 98.2 F (36.8 C), temperature source Oral, resp. rate 15, height 4' 11 (1.499 m), weight 43.1 kg, SpO2 97%.        Intake/Output Summary (Last 24 hours) at 06/08/2024 1611 Last data filed at 06/08/2024 9266 Gross per 24 hour  Intake 240 ml  Output 800 ml  Net -560 ml   Filed Weights   06/06/24 0950 06/07/24 1409  Weight: 46.3 kg 43.1 kg   Physical Exam: General: Chronically ill-appearing, no acute distress HENT: [place], AT Eyes: EOMI, no scleral icterus Respiratory: No respiratory distress Neuro: AAO x2, CNII-XII grossly intact  Imaging, labs and test in EMR in the last 24 hours reviewed independently by me. Pertinent findings below: CTA  Resolved problem list   Assessment and  Plan   Spiculated subpleural right upper lobe lung mass measuring 2.1 x 1.2 cm Right paratracheal and right hilar adenopathy Left lobe liver hypodensity concerning for mets Emphysema/COPD exacerbation Acute vs chronic hypoxemic respiratory failure  Unable to obtain biopsy for liver. 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  I reviewed imaging with patient's son. Based on imaging, lung mass is highly concerning for malignancy with liver lesion suggestive of metastatic foci.   We discussed risks and benefits of EBUS bronchoscopy. He is not interested in navigational bronchoscopy for his mother due to the higher pulmonary post-op risk including pneumothorax and respiratory failure. Addressed questions and concerns. Family interested in holding spot for bronchoscopy with EBUS tomorrow at 3pm. NPO status ordered for midnight. However son stated family and patient would need to discuss goals of care and may decide to cancel procedure if they decided as a family to be conservative with care.  Regarding her emphysema recommend starting triple therapy. Breztri  ordered. New O2 requirement may be chronic, wean for goal >88%. Reasonable to treated with prednisone  for presumed exacerbation. Will need ambulatory O2 prior to discharge to determine home O2 needs.     Critical care time: N/A    MDM: High Care Time: >50 min with >50% face to face and coordinating care for add on case  Slater Staff, M.D. [facility] Pulmonary/Critical Care Medicine 06/08/2024 4:12 PM   See Amion for personal pager For hours between 7 PM to 7 AM, please call Elink for urgent questions       "

## 2024-06-09 DIAGNOSIS — J9601 Acute respiratory failure with hypoxia: Secondary | ICD-10-CM | POA: Diagnosis not present

## 2024-06-09 MED ORDER — PREDNISONE 20 MG PO TABS
20.0000 mg | ORAL_TABLET | Freq: Every day | ORAL | Status: DC
  Administered 2024-06-10: 20 mg via ORAL
  Filled 2024-06-09: qty 1

## 2024-06-09 MED ORDER — HYDROCODONE-ACETAMINOPHEN 5-325 MG PO TABS
1.0000 | ORAL_TABLET | Freq: Four times a day (QID) | ORAL | Status: DC | PRN
  Administered 2024-06-09 – 2024-06-10 (×4): 1 via ORAL
  Filled 2024-06-09 (×4): qty 1

## 2024-06-09 NOTE — Plan of Care (Signed)
  Problem: Clinical Measurements: Goal: Ability to maintain clinical measurements within normal limits will improve Outcome: Progressing Goal: Respiratory complications will improve Outcome: Progressing   Problem: Activity: Goal: Risk for activity intolerance will decrease Outcome: Progressing   

## 2024-06-09 NOTE — TOC Initial Note (Signed)
 Transition of Care [facility]) - Initial/Assessment Note    Patient Details  Name: Cynthia Walton MRN: [id number] Date of Birth: April 08, 1941  Transition of Care [facility]) CM/SW Contact:    Roxie KANDICE Stain, RN Phone Number: 06/09/2024, 3:49 PM  Clinical Narrative:                  [facility] consult for outpatient palliative. This RN case manager reached out to patient who confirms she would like palliative outpatient. Melissa with [facility] notified and can excepts referral.   Expected Discharge Plan: Home/Self Care Barriers to Discharge: Continued Medical Work up   Patient Goals and CMS Choice Patient states their goals for this hospitalization and ongoing recovery are:: Return home CMS Medicare.gov Compare Post Acute Care list provided to:: Patient Choice offered to / list presented to : Patient      Expected Discharge Plan and Services In-house Referral: Hospice / Palliative Care Discharge Planning Services: CM Consult   Living arrangements for the past 2 months: Single Family Home                             [facility] Agency: [facility] Date [facility] Agency Contacted: 06/09/24 Time HH Agency Contacted: [id number] Representative spoke with at [facility] Agency: Melissa  Prior Living Arrangements/Services Living arrangements for the past 2 months: Single Family Home Lives with:: Self Patient language and need for interpreter reviewed:: Yes Do you feel safe going back to the place where you live?: Yes      Need for Family Participation in Patient Care: Yes (Comment) Care giver support system in place?: Yes (comment)   Criminal Activity/Legal Involvement Pertinent to Current Situation/Hospitalization: No - Comment as needed  Activities of Daily Living   ADL Screening (condition at time of admission) Independently performs ADLs?: Yes (appropriate for developmental age) Is the patient deaf or have difficulty hearing?: No Does the patient have difficulty seeing, even  when wearing glasses/contacts?: No Does the patient have difficulty concentrating, remembering, or making decisions?: No  Permission Sought/Granted Permission sought to share information with : Other (comment) Permission granted to share information with : Yes, Verbal Permission Granted     Permission granted to share info w AGENCY: Palliative        Emotional Assessment   Attitude/Demeanor/Rapport: Engaged Affect (typically observed): Accepting Orientation: : Oriented to Self, Oriented to Place, Oriented to Situation, Oriented to  Time Alcohol / Substance Use: Not Applicable Psych Involvement: No (comment)  Admission diagnosis:  Lung mass [R91.8] Acute respiratory failure with hypoxia (HCC) [J96.01] Nonspecific chest pain [R07.9] Acute hypoxic respiratory failure (HCC) [J96.01] Patient Active Problem List   Diagnosis Date Noted   Metastatic disease (HCC) 06/07/2024   Mass of right lung 06/07/2024   COPD (chronic obstructive pulmonary disease) (HCC) 06/07/2024   Hyperlipidemia 06/07/2024   Leukocytosis 06/07/2024   Hyponatremia 06/07/2024   Hypercalcemia 06/07/2024   Mild cognitive impairment 06/07/2024   Insomnia 06/07/2024   GERD (gastroesophageal reflux disease) 06/07/2024   Chest pain 06/07/2024   Abnormal EKG 06/07/2024   Acute hypoxic respiratory failure (HCC) 06/06/2024   PCP:  Alray Loader, MD Pharmacy:   [facility] [address] [place], [place] - [address] [place] [address] Phone: [phone] Fax: [phone]     Social Drivers of Health (SDOH) Social History: SDOH Screenings   Food Insecurity: No Food Insecurity (06/07/2024)  Housing: Low Risk (06/07/2024)  Transportation Needs: No Transportation Needs (06/07/2024)  Utilities: Not At Risk (06/07/2024)  Social Connections: Moderately Integrated (06/09/2024)  Tobacco Use: Medium Risk (06/06/2024)   SDOH Interventions:     Readmission Risk Interventions      No data to display

## 2024-06-09 NOTE — Consult Note (Signed)
 "                              Consultation Note Date: 06/09/2024   Patient Name: Cynthia Walton  DOB: Jun 24, 1940  MRN: [id number]  Age / Sex: 84 y.o., female  PCP: Alray Loader, MD Referring Physician: Fairy Frames, MD  Reason for Consultation: Establishing goals of care  HPI/Patient Profile: 84 y.o. female  with past medical history of remote tobacco abuse, COPD, hyperlipidemia, GERD admitted on 06/06/2024 with worsening shortness of breath, back pain.   Patient shares with me today that her worsening back pain is the main reason she presented to the ED.  Has been worsening for the past 2 months.  Per intake HPI, patient had hypoxia which improved on 1.5 L of nasal cannula, as well as CT angiogram of the chest with right upper lobe mass measuring 2.1 x 1.2 cm concerning for bronchogenic malignancy, as well as pathologic right paratracheal and right hilar adenopathy concerning for nodal metastasis.  Liver hypodensity ultimately thought to be a cyst.  She was admitted with acute hypoxic respiratory failure.  Ultimately decided against bronchoscopy/biopsy after discussion with pulmonology.  Decision made for DNR/DNI after discussion with primary attending.  PMT has been consulted to assist with goals of care conversation.  Clinical Assessment and Goals of Care:  I have reviewed medical records including EPIC notes, labs and imaging, assessed the patient and then met at the bedside with patient's son Adriana and niece Darla, with patient's sister Dorthea on the phone to discuss diagnosis prognosis, GOC, EOL wishes, disposition and options.  I introduced Palliative Medicine as specialized medical care for people living with serious illness. It focuses on providing relief from the symptoms and stress of a serious illness. The goal is to improve quality of life for both the patient and the family.  We discussed a brief life review of the patient and then focused on their current illness.   I  attempted to elicit values and goals of care important to the patient.    Medical History Review and Understanding:  We discussed patient's acute illness in the context of their chronic comorbidities.  Patient and family understand the severity of patient's illness.  Social History: Patient has enjoyed being in the outdoors, working on her flowers.  She has only 1 son, 2 sisters, and a niece who is like her sister.  Functional and Nutritional State: Patient did not use any assistive devices prior to admission.  She needed to take breaks after some ambulating.  Palliative Symptoms: Pain in her back, slightly improved since admission Constipation, had her first bowel movement in 3 days today  Advance Directives: A detailed discussion regarding advanced directives was had.  No documentation currently on file.  Provided with blank copy, education on healthcare power of attorney and living well components, encouraging to complete with notary tomorrow if possible prior to discharge.  Patient family agreeable.  Code Status: Concepts specific to code status, artifical feeding and hydration, and rehospitalization were considered and discussed.  DNR/DNI confirmed.  Patient would never want a feeding tube.  We had a detailed conversation surrounding rehospitalization.  Initially patient did not want to come back to the hospital, but after further discussion she would be agreeable to come if there was something treatable but not for her cancer.  Discussion: Patient confirms with me today that she is at peace with her decision to avoid biopsy or  cancer treatment.  We discussed niece's question of why oncology is not at least part of the care - unable to give specific prognostication without biopsy results.  Patient's focus for the remainder of her life is to maneuver the best that she can. Clarified patient's concern that she would not do well with rehab with her desire to attempt anything that might  improve her mobility.  After spending time providing education on her treatment options and delineating the overall focus of her care (improvement versus comfort), she understands that therapy is the main intervention that could be attempted and she is willing to try.  She is not ready for full comfort focus.  Her back pain is improved from being almost debilitating prior to admission.  We discussed expected symptom progression as her cancer gets worse without treatment.  Currently, her quality of life is acceptable and she is not excessively suffering.  In addition to education surrounding advanced directives and hospice resources later on once she is ready, we had a review of MOST form and patient was able to complete with support from her family.  She would never want a feeding tube, but she remains agreeable to limited medical interventions if she is able to recover from a treatable illness.  She understands this takes effect today and can change at any time.  Patient and family also agreeable to outpatient palliative care.  Patient's niece has palliative services as well for lung cancer.   The difference between aggressive medical intervention and comfort care was considered in light of the patient's goals of care. Hospice and Palliative Care services outpatient were explained and offered.   Discussed the importance of continued conversation with family and the medical providers regarding overall plan of care and treatment options, ensuring decisions are within the context of the patients values and GOCs.   Questions and concerns were addressed.  Hard Choices booklet left for review. The family was encouraged to call with questions or concerns.  PMT will continue to support holistically.   SUMMARY OF RECOMMENDATIONS   - Continue DNR/DNI - Continue current care plan.  Patient's goal is for improved mobility and she is agreeable to treating the treatable without aggressive measures or heroics -  TOC consulted for outpatient palliative care referral, assistance appreciated - Spiritual care consulted for assistance with advance directives, hopefully tomorrow prior to discharge.  Patient will make plan to obtain notary on her own if unable to complete - Psychosocial and emotional support provided - PMT will continue to follow and support as needed  Prognosis:  Unable to determine  Discharge Planning: Home with Palliative Services      Primary Diagnoses: Present on Admission:  Acute hypoxic respiratory failure (HCC)  Metastatic disease (HCC)  Mass of right lung  COPD (chronic obstructive pulmonary disease) (HCC)  Hyperlipidemia  Leukocytosis  Hyponatremia  Hypercalcemia  Mild cognitive impairment  Insomnia  GERD (gastroesophageal reflux disease)  Chest pain  Abnormal EKG   Physical Exam Vitals and nursing note reviewed.  Constitutional:      General: She is not in acute distress. HENT:     Head: Normocephalic and atraumatic.  Cardiovascular:     Rate and Rhythm: Normal rate.  Pulmonary:     Effort: Pulmonary effort is normal. No respiratory distress.  Skin:    General: Skin is warm and dry.  Neurological:     Mental Status: She is alert and oriented to person, place, and time.  Psychiatric:  Mood and Affect: Mood normal.        Behavior: Behavior normal.    Vital Signs: BP 118/64 (BP Location: Left Arm)   Pulse 85   Temp 98.3 F (36.8 C) (Oral)   Resp 19   Ht 4' 11 (1.499 m)   Wt 43.1 kg   SpO2 95%   BMI 19.19 kg/m  Pain Scale: 0-10   Pain Score: 5    SpO2: SpO2: 95 % O2 Device:SpO2: 95 % O2 Flow Rate: .O2 Flow Rate (L/min): 1 L/min    Ayoub Arey SHAUNNA Fell, PA-C  Palliative Medicine Team Team phone # [phone]  Thank you for allowing the Palliative Medicine Team to assist in the care of this patient. Please utilize secure chat with additional questions, if there is no response within 30 minutes please call the above phone  number.  Palliative Medicine Team providers are available by phone from 7am to 7pm daily and can be reached through the team cell phone.  Should this patient require assistance outside of these hours, please call the patient's attending physician.   I personally spent a total of 90 minutes in the care of the patient today including preparing to see the patient, getting/reviewing separately obtained history, performing a medically appropriate exam/evaluation, counseling and educating, referring and communicating with other health care professionals, documenting clinical information in the EHR, coordinating care, and completion of MOST form.  "

## 2024-06-09 NOTE — Progress Notes (Signed)
 [facility] Liaison note:  Notified by care manager Kristie of patient/family request for [facility] Palliative services at home after discharge.  Hospital liaison will follow patient for discharge disposition.  Please call with any hospice or outpatient palliative care related questions. Thank you for the opportunity to participate in this patient's care.  Eleanor Nail, LPN [facility] Liaison [phone]

## 2024-06-09 NOTE — TOC CM/SW Note (Signed)
 Transition of Care [facility]) - Inpatient Brief Assessment   Patient Details  Name: RANDE DARIO MRN: [id number] Date of Birth: 1940/07/30  Transition [facility] Care [facility]) CM/SW Contact:    Roxie KANDICE Stain, RN Phone Number: 06/09/2024, 11:44 AM   Clinical Narrative:  Patient has lung mass. Watch for patient's home O2 needs. ICM-inpatient case management will continue to follow.  Transition of Care Asessment: Insurance and Status: Insurance coverage has been reviewed Patient has primary care physician: Yes Home environment has been reviewed: Safe to discharge home   Prior/Current Home Services: No current home services Social Drivers of Health Review: SDOH reviewed no interventions necessary Readmission risk has been reviewed: Yes Transition of care needs: transition of care needs identified, TOC will continue to follow

## 2024-06-09 NOTE — Consult Note (Addendum)
" ° °  NAME:  Cynthia Walton, MRN:  [id number], DOB:  Sep 27, 1940, LOS: 2 ADMISSION DATE:  06/06/2024, CONSULTATION DATE:  06/07/24 REFERRING MD:  Maximino Sharps, MD CHIEF COMPLAINT:  Right lung mass   History of Present Illness:  84 year old female former smoker >30 years with severe emphysema, HLD, GERD who presented with shortness of breath and chest pain. Endorses weight loss 12 lbs in the last 3 months. Denies cough or wheezing. In the ED vitals significant for hypoxemia to 80s and started on 1-2L O2.  PCCM consulted for CTA neg for PE but significant for spiculated right upper lobe mass 2.1 x 1.2 cm with background severe emphysema. Mediastinal enlargement also seen including right paratracheal and right hilar adenopathy. Left lobe liver hypodensity also seen. IR planning for liver biopsy. PCCM consulted to discuss alternative options.    Pertinent  Medical History  As above  Significant Hospital Events: Including procedures, antibiotic start and stop dates in addition to other pertinent events     Interim History / Subjective:  Patient and family confirmed they do not want to pursue biopsy  Objective    Blood pressure 125/72, pulse 75, temperature 97.6 F (36.4 C), temperature source Oral, resp. rate 18, height 4' 11 (1.499 m), weight 43.1 kg, SpO2 100%.        Intake/Output Summary (Last 24 hours) at 06/09/2024 1149 Last data filed at 06/09/2024 0930 Gross per 24 hour  Intake 6 ml  Output --  Net 6 ml   Filed Weights   06/06/24 0950 06/07/24 1409  Weight: 46.3 kg 43.1 kg   Physical Exam: General: Elderly-appearing, no acute distress HENT: [place], AT Eyes: EOMI, no scleral icterus Respiratory: Diminished to auscultation bilaterally.  No crackles, wheezing or rales Cardiovascular: RRR, -M/R/G, no JVD Extremities:-Edema,-tenderness Neuro: AAO x4, CNII-XII grossly intact Psych: Normal mood, normal affect   Imaging, labs and test in EMR in the last 24 hours reviewed independently  by me. Pertinent findings below: CTA  Resolved problem list   Assessment and Plan   Spiculated subpleural right upper lobe lung mass measuring 2.1 x 1.2 cm Right paratracheal and right hilar adenopathy Left lobe liver hypodensity concerning for mets Unable to obtain biopsy for liver. 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 After discussion with patient and family they have elected to not pursue bronchoscopy  Emphysema/COPD exacerbation Acute vs chronic hypoxemic respiratory failure chronic vs acute exacerbation driving oxygen requirement -Continue Breztri . Will need to evaluate outpatient inhaler options -Prior to discharge, will need ambulatory O2 to determine home O2 needs -Goal SpO2 >88% -Complete prednisone  taper -Consider PFTs when stable -Scheduled for outpatient pulmonary follow-up on 1/28  Pulmonary will sign off     Critical care time: N/A    MDM Low  Slater Staff, M.D. [facility] Pulmonary/Critical Care Medicine 06/09/2024 11:49 AM   See Amion for personal pager For hours between 7 PM to 7 AM, please call Elink for urgent questions     "

## 2024-06-09 NOTE — Plan of Care (Signed)
" °  Problem: Health Behavior/Discharge Planning: Goal: Ability to manage health-related needs will improve Outcome: Progressing   Problem: Education: Goal: Knowledge of General Education information will improve Description: Including pain rating scale, medication(s)/side effects and non-pharmacologic comfort measures Outcome: Progressing   Problem: Clinical Measurements: Goal: Ability to maintain clinical measurements within normal limits will improve Outcome: Progressing Goal: Will remain free from infection Outcome: Progressing Goal: Diagnostic test results will improve Outcome: Progressing Goal: Respiratory complications will improve Outcome: Progressing Goal: Cardiovascular complication will be avoided Outcome: Progressing   Problem: Activity: Goal: Risk for activity intolerance will decrease Outcome: Progressing   Problem: Nutrition: Goal: Adequate nutrition will be maintained Outcome: Progressing   Problem: Coping: Goal: Level of anxiety will decrease Outcome: Progressing   Problem: Elimination: Goal: Will not experience complications related to bowel motility Outcome: Progressing Goal: Will not experience complications related to urinary retention Outcome: Progressing   Problem: Pain Managment: Goal: General experience of comfort will improve and/or be controlled Outcome: Progressing   Problem: Safety: Goal: Ability to remain free from injury will improve Outcome: Progressing   Problem: Skin Integrity: Goal: Risk for impaired skin integrity will decrease Outcome: Progressing   "

## 2024-06-09 NOTE — Progress Notes (Signed)
 " PROGRESS NOTE    Cynthia Walton  FMW:[id number] DOB: [birth year] DOA: 06/06/2024 PCP: Alray Loader, MD  84 y.o. female with medical history significant of COPD, hyperlipidemia, GERD, and prior tobacco abuse presented with progressively worsening shortness of breath and chest pain. She experiences progressively worsening shortness of breath, particularly noticeable at night. She does not use oxygen therapy. She has a significant history of smoking, having quit 20 years ago after smoking for at least 30 years. She has pain primarily in the upper chest and back, with the back being the most painful area. The pain is exacerbated by straining and movements. In the ED noted to be hypoxic, O2 saturations in the 80s on room air with improvement on 1.5 L of nasal cannula oxygen O2 96%.  Labs 1/12 noted WBC 9.4, platelets 458, sodium 132, chloride 97, glucose 110, and high-sensitivity troponin 15.  Chest x-ray noted emphysema without acute abnormality.  CTA chest noted right upper lobe mass measuring 2.1 X1 0.2 cm with paratracheal and hilar adenopathy, indeterminant liver lobe hypodensity noted as well . 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  Question possible bronchogenic carcinoma.  Risk factors include significant  history of tobacco abuse.  Imaging -- Appreciate IR consult, liver lesions more consistent with cysts, biopsy deferred - - Pulmonary reconsulted, discussed risk benefits and plan for bronchoscopy/EBUS, biopsy, due to risks of pneumothorax, need for intubation, patient and family decided to defer further workup and left nature take its course -Will request palliative consult for goals of care   Leukocytosis Resolved, likely reactive   Chest pain Abnormal EKG Likely secondary to above, reports chest pain wrapping around to her back intermittently, troponin negative, CTA chest as noted above   Hyponatremia -Likely secondary to above, mild to monitor   Hypercalcemia Rib solved   Mild cognitive impairment Family makes note the patient has some issues with her memory. - Delirium precautions  Hyperlipidemia - Continue Zetia  and fenofibrate     Anxiety/insomnia - Continue Xanax  as needed.     GERD - Continue omeprazole   DVT prophylaxis: SCDs  Advance Care Planning: Discussed CODE STATUS today, she was agreeable to DNR   Consults: Pulmonology, interventional radiology   Family Communication: Patient's sister updated at bedside yesterday Disposition Plan: Home later today or in a.m.  Consultants: Pulmonary, palliative care   Procedures:   Antimicrobials:    Objective: Vitals:   06/08/24 1944 06/08/24 2249 06/09/24 0342 06/09/24 0829  BP: 130/74 (!) 105/51 132/67 125/72  Pulse: 98 99 100 75  Resp: 18 16 17 18   Temp: 98.2 F (36.8 C) 97.8 F (36.6 C) 98.2 F (36.8 C) 97.6 F (36.4 C)  TempSrc: Oral Oral Oral Oral  SpO2: 97% 97% 96% 100%  Weight:      Height:        Intake/Output  Summary (Last 24 hours) at 06/09/2024 1153 Last data filed at 06/09/2024 0930 Gross per 24 hour  Intake 6 ml  Output --  Net 6 ml   Filed Weights   06/06/24 0950 06/07/24 1409  Weight: 46.3 kg 43.1 kg    Examination:  General exam: Appears calm and comfortable, AAO x 3, no  distress Respiratory system: Poor air movement bilaterally Cardiovascular system: S1 & S2 heard, RRR.  Abd: nondistended, soft and nontender.Normal bowel sounds heard. Central nervous system: Alert and oriented. No focal neurological deficits. Extremities: no edema Skin: No rashes Psychiatry:  Mood & affect appropriate.     Data Reviewed:   CBC: Recent Labs  Lab 06/06/24 0953 06/07/24 0731 06/08/24 0241  WBC 9.4 10.7* 9.5  NEUTROABS  --  8.6*  --   HGB 13.7 13.5 12.8  HCT 39.9 39.5 37.0  MCV 93.9 94.0 94.4  PLT 458* 485* 474*   Basic Metabolic Panel: Recent Labs  Lab 06/06/24 1140 06/07/24 0731 06/08/24 0241  NA 132* 134* 132*  K 4.3 4.7 4.3  CL 97* 97* 97*  CO2 24 24 24   GLUCOSE 110* 101* 144*  BUN 9 12 11   CREATININE 0.80 0.62 0.59  CALCIUM 10.2 10.5* 9.9   GFR: Estimated Creatinine Clearance: 36.9 mL/min (by C-G formula based on SCr of 0.59 mg/dL). Liver Function Tests: No results for input(s): AST, ALT, ALKPHOS, BILITOT, PROT, ALBUMIN in the last 168 hours. No results for input(s): LIPASE, AMYLASE in the last 168 hours. No results for input(s): AMMONIA in the last 168 hours. Coagulation Profile: Recent Labs  Lab 06/08/24 0241  INR 1.1   Cardiac Enzymes: No results for input(s): CKTOTAL, CKMB, CKMBINDEX, TROPONINI in the last 168 hours. BNP (last 3 results) No results for input(s): PROBNP in the last 8760 hours. HbA1C: No results for input(s): HGBA1C in the last 72 hours. CBG: No results for input(s): GLUCAP in the last 168 hours. Lipid Profile: No results for input(s): CHOL, HDL, LDLCALC, TRIG, CHOLHDL, LDLDIRECT in the last 72 hours. Thyroid Function Tests: Recent Labs    06/07/24 2105  TSH 1.010   Anemia Panel: No results for input(s): VITAMINB12, FOLATE, FERRITIN, TIBC, IRON, RETICCTPCT in the last 72 hours. Urine analysis: No results found for: COLORURINE, APPEARANCEUR,  LABSPEC, PHURINE, GLUCOSEU, HGBUR, BILIRUBINUR, KETONESUR, PROTEINUR, UROBILINOGEN, NITRITE, LEUKOCYTESUR Sepsis Labs: @LABRCNTIP (procalcitonin:4,lacticidven:4)  ) Recent Results (from the past 240 hours)  MRSA Next Gen by PCR, Nasal     Status: None   Collection Time: 06/07/24  2:12 PM   Specimen: Nasal Mucosa; Nasal Swab  Result Value Ref Range Status   MRSA by PCR Next Gen NOT DETECTED NOT DETECTED Final    Comment: (NOTE) The GeneXpert MRSA Assay (FDA approved for NASAL specimens only), is one component of a comprehensive MRSA colonization surveillance program. It is not intended to diagnose MRSA infection nor to guide or monitor treatment for MRSA infections. Test performance is not FDA approved in patients less than 52 years old. Performed at [facility], [address], [place], [place] [address]   Resp panel by RT-PCR (RSV, Flu A&B, Covid) Anterior Nasal Swab     Status: None   Collection Time: 06/07/24  5:15 PM   Specimen: Anterior Nasal Swab  Result Value Ref Range Status   SARS Coronavirus 2 by RT PCR NEGATIVE NEGATIVE Final   Influenza A by PCR NEGATIVE NEGATIVE Final   Influenza B by PCR NEGATIVE NEGATIVE Final    Comment: (NOTE)  The Xpert Xpress SARS-CoV-2/FLU/RSV plus assay is intended as an aid in the diagnosis of influenza from Nasopharyngeal swab specimens and should not be used as a sole basis for treatment. Nasal washings and aspirates are unacceptable for Xpert Xpress SARS-CoV-2/FLU/RSV testing.  Fact Sheet for Patients: bloggercourse.com  Fact Sheet for Healthcare Providers: seriousbroker.it  This test is not yet approved or cleared by the [place]  FDA and has been authorized for detection and/or diagnosis of SARS-CoV-2 by FDA under an Emergency Use Authorization (EUA). This EUA will remain in effect (meaning this test can be used) for the duration of the COVID-19  declaration under Section 564(b)(1) of the Act, 21 U.S.C. section 360bbb-3(b)(1), unless the authorization is terminated or revoked.     Resp Syncytial Virus by PCR NEGATIVE NEGATIVE Final    Comment: (NOTE) Fact Sheet for Patients: bloggercourse.com  Fact Sheet for Healthcare Providers: seriousbroker.it  This test is not yet approved or cleared by the [place]  FDA and has been authorized for detection and/or diagnosis of SARS-CoV-2 by FDA under an Emergency Use Authorization (EUA). This EUA will remain in effect (meaning this test can be used) for the duration of the COVID-19 declaration under Section 564(b)(1) of the Act, 21 U.S.C. section 360bbb-3(b)(1), unless the authorization is terminated or revoked.  Performed at [facility], [address], [place], [place] [address]      Radiology Studies: US  Abdomen Limited Result Date: 06/09/2024 CLINICAL DATA:  [id number] Liver mass [id number]. New diagnosis of spiculated RIGHT lung mass suspicious for malignancy, and hypodense hepatic lesions. Question hepatic metastases. EXAM: ULTRASOUND ABDOMEN LIMITED COMPARISON:  CTA chest, 06/06/2024.  CT AP, 06/07/2024. FINDINGS: Focused ultrasound at the along the LEFT and RIGHT hepatic lobes correlating with hypodense masses seen on comparison CT. Demonstrated within the LEFT and RIGHT hepatic lobes are well-circumscribed anechoic and avascular hepatic fluid collections measuring up to 1.7 and 1.0 cm respectively. No solid or additional suspicious hepatic lesion is identified. IMPRESSION: LEFT and RIGHT hepatic lobe hypodensities on comparison CT are consistent with simple-appearing hepatic cysts. Liver mass biopsy was NOT performed. Thom Hall, MD [facility] Radiology Electronically Signed   By: Thom Hall M.D.   On: 06/09/2024 08:44   CT HEAD W & WO CONTRAST ( ) Result Date:  06/08/2024 CLINICAL DATA:  Initial evaluation for metastatic disease. EXAM: CT HEAD WITHOUT AND WITH CONTRAST TECHNIQUE: Contiguous axial images were obtained from the base of the skull through the vertex without and with intravenous contrast. RADIATION DOSE REDUCTION: This exam was performed according to the departmental dose-optimization program which includes automated exposure control, adjustment of the mA and/or kV according to patient size and/or use of iterative reconstruction technique. CONTRAST:  75mL OMNIPAQUE  IOHEXOL  350 MG/ML SOLN COMPARISON:  CT from 07/16/2020. FINDINGS: Brain: Generalized age-related cerebral atrophy. Patchy and confluent hypodensity involving the supratentorial cerebral white matter, consistent with chronic small vessel ischemic disease, advanced in nature. 1 cm hypodensity at the anterior limb of the left internal capsule (series 9, image 18), age indeterminate. No other acute large vessel territory infarct. No acute intracranial hemorrhage. No mass lesion, mass effect, or midline shift. No abnormal enhancement seen following contrast administration. No evidence for intracranial metastatic disease. No hydrocephalus or extra-axial fluid collection. Vascular: No abnormal hyperdense vessel seen prior to contrast administration. Calcified atherosclerosis present at skull base. Following contrast administration shin, normal intravascular enhancement seen throughout the major intracranial vascular structures. Skull: Scalp soft tissues demonstrate no acute finding. Calvarium  intact. No focal osseous lesions. Sinuses/Orbits: Globes and orbital soft tissues within normal limits. Ocular senescent calcifications noted. Paranasal sinuses are largely clear. No significant mastoid effusion. Other: None. IMPRESSION: 1. No evidence for intracranial metastatic disease. 2. 1 cm hypodensity at the anterior limb of the left internal capsule, age indeterminate. While this finding could be chronic in  nature, a possible small acute to subacute ischemic infarct could also potentially have this appearance. Correlation with dedicated MRI could be performed for further evaluation as warranted. 3. Underlying age-related cerebral atrophy with advanced chronic small vessel ischemic disease. Electronically Signed   By: Morene Hoard M.D.   On: 06/08/2024 03:37   CT ABDOMEN PELVIS WO CONTRAST Result Date: 06/07/2024 EXAM: CT ABDOMEN AND PELVIS WITH CONTRAST 06/07/2024 06:11:38 PM TECHNIQUE: CT of the abdomen and pelvis was performed with the administration of intravenous contrast. Multiplanar reformatted images are provided for review. Automated exposure control, iterative reconstruction, and/or weight-based adjustment of the mA/kV was utilized to reduce the radiation dose to as low as reasonably achievable. COMPARISON: CT chest 06/06/2024 and report from abdominal ultrasound dated 03/06/2016 (images not available). CLINICAL HISTORY: Cancer staging. Thoracic adenopathy with right upper lobe irregular nodule suspicious for malignancy, for further staging assessment. * Tracking Code: BO * FINDINGS: LOWER CHEST: Descending thoracic aortic atherosclerosis. Right coronary artery atherosclerosis. Emphysema at the lung bases. LIVER: 1.6 x 1.3 cm hypodense lesion in the lateral segment left hepatic lobe on image 14 series 2, internal density of 7.3 compatible with a cyst; also a 1.6 cm simple cyst was described in the left hepatic lobe on the ultrasound of 03/06/2016. The appearance was more ambiguous on the contrast enhanced CT chest from 06/06/2024 with a somewhat heterogeneous appearance, but this was probably due to motion artifact. A similar 1.1 cm lesion in the right hepatic lobe on image 17 of series 12 has a noncontrast CT density of 5 Hounsfield units on image 55 series 6, favoring a benign cyst. GALLBLADDER AND BILE DUCTS: Gallbladder is unremarkable. No biliary ductal dilatation. SPLEEN: No acute abnormality.  PANCREAS: No acute abnormality. ADRENAL GLANDS: No acute abnormality. KIDNEYS, URETERS AND BLADDER: 7.6 x 5.6 cm homogeneous fluid density lesion of the left kidney upper pole, favoring benign cyst and not requiring further workup. Nonspecific 0.9 cm hypodense lesion of the right mid to lower kidney medially on image 30 series 2, nonspecific. High density throughout the urinary bladder compatible with excreted contrast medium. No stones in the kidneys or ureters. No hydronephrosis. No perinephric or periureteral stranding. GI AND BOWEL: Stomach demonstrates no acute abnormality. Suspected distal jejunal diverticulum on image 26 series 2 with internal air fluid level. Sigmoid colon diverticulosis. There is no bowel obstruction. PERITONEUM AND RETROPERITONEUM: No ascites. No free air. VASCULATURE: Aorta is normal in caliber. Atheromatous plaque at the origins of the celiac trunk and superior mesenteric artery. LYMPH NODES: No lymphadenopathy. REPRODUCTIVE ORGANS: No acute abnormality. BONES AND SOFT TISSUES: Sacralized L5 vertebral body. Mild grade 1 degenerative anterolisthesis at L4-L5 with L4 superior endplate concavity suggesting remote mild compression fracture. No focal soft tissue abnormality. IMPRESSION: 1. No findings of active malignancy in the abdomen/pelvis on today's noncontrast CT scan. 2. Mild grade 1 degenerative anterolisthesis at L4-5 with L4 superior endplate concavity suggesting remote mild compression fracture. 3. Descending thoracic aortic atherosclerosis. 4. Right coronary artery atherosclerosis. 5. Emphysema at the lung bases. 6. Sacralized L5 vertebral body. 7. Atheromatous plaque at the origins of the celiac trunk and superior mesenteric artery. 8. Sigmoid colon  diverticulosis. Electronically signed by: Ryan Salvage MD 06/07/2024 06:31 PM EST RP Workstation: [id number]     Scheduled Meds:  albuterol   2.5 mg Nebulization BID   ALPRAZolam   0.25 mg Oral QHS   ALPRAZolam   0.5 mg Oral  Once   budesonide -glycopyrrolate -formoterol   2 puff Inhalation BID   ezetimibe   10 mg Oral QHS   fenofibrate   160 mg Oral Daily   guaiFENesin   600 mg Oral BID   magnesium  oxide  400 mg Oral QHS   melatonin  3 mg Oral QHS   pantoprazole   40 mg Oral Daily   predniSONE   40 mg Oral Q breakfast   sodium chloride  flush  3 mL Intravenous Q12H   Continuous Infusions:   LOS: 2 days    Time spent:    Sigurd Pac, MD [facility]   06/09/2024, 11:53 AM    "

## 2024-06-10 ENCOUNTER — Other Ambulatory Visit (HOSPITAL_COMMUNITY): Payer: Self-pay

## 2024-06-10 DIAGNOSIS — J9601 Acute respiratory failure with hypoxia: Secondary | ICD-10-CM | POA: Diagnosis not present

## 2024-06-10 MED ORDER — ALBUTEROL SULFATE HFA 108 (90 BASE) MCG/ACT IN AERS: 2.0000 | INHALATION_SPRAY | Freq: Four times a day (QID) | RESPIRATORY_TRACT | Status: AC | PRN

## 2024-06-10 MED ORDER — SENNOSIDES-DOCUSATE SODIUM 8.6-50 MG PO TABS: 1.0000 | ORAL_TABLET | Freq: Every day | ORAL | 0 refills | Status: DC

## 2024-06-10 MED ORDER — ALBUTEROL SULFATE HFA 108 (90 BASE) MCG/ACT IN AERS
2.0000 | INHALATION_SPRAY | RESPIRATORY_TRACT | 0 refills | Status: DC | PRN
  Filled 2024-06-10: qty 1, fill #0

## 2024-06-10 MED ORDER — SENNOSIDES-DOCUSATE SODIUM 8.6-50 MG PO TABS
1.0000 | ORAL_TABLET | Freq: Every day | ORAL | 0 refills | Status: AC
  Filled 2024-06-10: qty 30, 30d supply, fill #0

## 2024-06-10 MED ORDER — PREDNISONE 20 MG PO TABS
20.0000 mg | ORAL_TABLET | Freq: Every day | ORAL | 0 refills | Status: AC
  Filled 2024-06-10: qty 2, 2d supply, fill #0

## 2024-06-10 MED ORDER — HYDROCODONE-ACETAMINOPHEN 5-325 MG PO TABS
1.0000 | ORAL_TABLET | Freq: Four times a day (QID) | ORAL | 0 refills | Status: AC | PRN
  Filled 2024-06-10: qty 30, 7d supply, fill #0

## 2024-06-10 NOTE — Progress Notes (Signed)
 Went over AVS with patient/family member, TOC meds given, home O2 delivered to room, Ivs removed, patient belongings gathered, signed DNR/Most forms placed in AVS packet, and patient to be wheeled out to family vehicle.

## 2024-06-10 NOTE — Progress Notes (Signed)
 SATURATION QUALIFICATIONS: (This note is used to comply with regulatory documentation for home oxygen)  Patient Saturations on Room Air at Rest = 94%  Patient Saturations on Room Air while Ambulating = 87%  Patient Saturations on 2 Liters of oxygen while Ambulating = 92%  Please briefly explain why patient needs home oxygen: Patients O2 desats while ambulating

## 2024-06-10 NOTE — Progress Notes (Signed)
 Patient refused morning vitals due to being discharged

## 2024-06-10 NOTE — Progress Notes (Signed)
 SPIRITUAL CARE AND COUNSELING CONSULT NOTE   VISIT SUMMARY Chaplain responded to spiritual care consult for advance directives. Paperwork provided just as pt [place] was about to be discharged. All questions answered.   SPIRITUAL ENCOUNTER                                                                                                                                                                      Type of Visit: Initial Care provided to:: Patient Conversation partners present during encounter: Nurse Reason for visit: Advance directives OnCall Visit: No  If immediate needs arise, please contact [place] 24 hour on call [phone]   Donnice JINNY Shuck, Chaplain  06/10/2024 11:31 AM

## 2024-06-10 NOTE — Care Management Important Message (Signed)
 Important Message  Patient Details  Name: JESENIA SPERA MRN: [id number] Date of Birth: [birth year]   Important Message Given:  Yes - Medicare IM  Patient left prior to IM delivery will mail a copy to the patients home address.    Sophina Mitten 06/10/2024, 2:40 PM

## 2024-06-10 NOTE — Discharge Summary (Signed)
 Physician Discharge Summary  Cynthia Walton FMW:[id number] DOB: February 13, 1941 DOA: 06/06/2024  PCP: Alray Loader, MD  Admit date: 06/06/2024 Discharge date: 06/10/2024  Time spent: 45 minutes  Recommendations for Outpatient Follow-up:  Outpatient palliative care, consider hospice when she deteriorates further PCP in 1 week   Discharge Diagnoses:  Principal Problem:   Acute hypoxic respiratory failure (HCC)   COPD (chronic obstructive pulmonary disease) (HCC)   Metastatic disease (HCC)   Mass of right lung   Chest pain   Abnormal EKG   Leukocytosis   Hyponatremia   Hypercalcemia   Mild cognitive impairment   Hyperlipidemia   Insomnia   GERD (gastroesophageal reflux disease)   Discharge Condition: Stable  Diet recommendation: Regular  Filed Weights   06/06/24 0950 06/07/24 1409  Weight: 46.3 kg 43.1 kg    History of present illness:  84 y.o. female with medical history significant of COPD, hyperlipidemia, GERD, and prior tobacco abuse presented with progressively worsening shortness of breath and chest pain. She experiences progressively worsening shortness of breath, particularly noticeable at night. She does not use oxygen therapy. She has a significant history of smoking, having quit 20 years ago after smoking for at least 30 years. She has pain primarily in the upper chest and back, with the back being the most painful area. The pain is exacerbated by straining and movements. In the ED noted to be hypoxic, O2 saturations in the 80s on room air with improvement on 1.5 L of nasal cannula oxygen O2 96%.  Labs 1/12 noted WBC 9.4, platelets 458, sodium 132, chloride 97, glucose 110, and high-sensitivity troponin 15.  Chest x-ray noted emphysema without acute abnormality.  CTA chest noted right upper lobe mass measuring 2.1 X1 0.2 cm with paratracheal and hilar adenopathy, indeterminant liver lobe hypodensity noted as well . - Seen by pulmonary, recommended liver biopsy per IR -  Seen by IR, liver lesions consistent with cysts, biopsy deferred - Pulmonary consulted  Hospital Course:   Acute respiratory failure with hypoxia COPD - Noted to be hypoxic in the ER, suspect this is chronic respiratory failure, advanced emphysema on imaging  - Continue Breztri , albuterol , cut down prednisone  - Did desaturate with activity, set up with home O2   Lung mass  Suspected metastatic disease - CT showing a 2.1 x 1.2 cm spiculated right upper lobe mass with pathologic right paratracheal and hilar adenopathy, indeterminate left liver hypodensity, and left pararenal soft tissue mass.  Question possible bronchogenic carcinoma.  Risk factors include significant history of tobacco abuse.  Imaging -Appreciate IR consult, liver lesions more consistent with cysts, biopsy deferred - Pulmonary reconsulted, discussed risk benefits and plan for bronchoscopy/EBUS, biopsy, due to risks of pneumothorax, need for intubation, patient and family decided to defer further workup and left nature take its course - palliative consult for goals of care, she will discharge home with outpatient palliative care, now DNR, plan to consider hospice down the road when she deteriorates further   Leukocytosis Resolved, likely reactive   Chest pain Abnormal EKG Likely secondary to above mass, adenopathy, reports chest pain wrapping around to her back intermittently, troponin negative, CTA chest as noted above -Improved, added hydrocodone    Hyponatremia -Likely secondary to above, mild to monitor   Hypercalcemia Resolved   Mild cognitive impairment Family makes note the patient has some issues with her memory. - Delirium precautions  Hyperlipidemia - Continue Zetia  and fenofibrate     Anxiety/insomnia - Continue Xanax  as needed.     GERD -  Continue omeprazole   Consultants: Pulmonary, palliative care   Discharge Exam: Vitals:   06/09/24 2354 06/10/24 0413  BP: 133/85 128/63  Pulse: 70 78   Resp: 17 16  Temp: 98.1 F (36.7 C) (!) 97.4 F (36.3 C)  SpO2: 96% 93%   General exam: Appears calm and comfortable, AAO x 3, no distress Respiratory system: Poor air movement bilaterally Cardiovascular system: S1 & S2 heard, RRR.  Abd: nondistended, soft and nontender.Normal bowel sounds heard. Central nervous system: Alert and oriented. No focal neurological deficits. Extremities: no edema Skin: No rashes Psychiatry:  Mood & affect appropriate.    Discharge Instructions   Discharge Instructions     Amb Referral to Palliative Care   Complete by: As directed    Diet general   Complete by: As directed    Increase activity slowly   Complete by: As directed       Allergies as of 06/10/2024       Reactions   Penicillins Hives, Rash, Swelling   Procaine Other (See Comments)   Mouth decay    Statins Other (See Comments)   Muscle aches Muscle aches        Medication List     TAKE these medications    albuterol  108 (90 Base) MCG/ACT inhaler Commonly known as: VENTOLIN  HFA Inhale 2 puffs into the lungs every 6 (six) hours as needed for wheezing or shortness of breath. What changed: how much to take   ALPRAZolam  0.25 MG tablet Commonly known as: XANAX  Take 0.25 mg by mouth at bedtime. Prn anxiety   Calcium-Magnesium -Zinc Tabs Take 1 tablet by mouth daily.   ezetimibe  10 MG tablet Commonly known as: ZETIA  Take 10 mg by mouth at bedtime.   fenofibrate  160 MG tablet Take 160 mg by mouth daily.   HYDROcodone -acetaminophen  5-325 MG tablet Commonly known as: NORCO/VICODIN Take 1 tablet by mouth every 6 (six) hours as needed for moderate pain (pain score 4-6) or severe pain (pain score 7-10).   magnesium  oxide 400 (240 Mg) MG tablet Commonly known as: MAG-OX Take 400 mg by mouth daily. At night   omeprazole 20 MG capsule Commonly known as: PRILOSEC Take 1 capsule by mouth daily.   predniSONE  20 MG tablet Commonly known as: DELTASONE  Take 1 tablet (20  mg total) by mouth daily with breakfast for 2 days. Start taking on: June 11, 2024   Trelegy Ellipta 100-62.5-25 MCG/ACT Aepb Generic drug: Fluticasone-Umeclidin-Vilant Inhale 1 puff into the lungs daily.   VITAMIN E PO Take 1 tablet by mouth daily.               Durable Medical Equipment  (From admission, onward)           Start     Ordered   06/10/24 0904  For home use only DME oxygen  Once       Question Answer Comment  Length of Need 6 Months   Mode or (Route) Nasal cannula   Liters per Minute 2   Oxygen conserving device Yes   Oxygen delivery system: Gas      06/10/24 0904           Allergies[1]  Follow-up Information     [facility] Follow up.   Specialty: Hospice and Palliative Medicine Why: Palliative will be in touch to schedule an appointment Contact information: [address] [facility] [place]  [phone]  The results of significant diagnostics from this hospitalization (including imaging, microbiology, ancillary and laboratory) are listed below for reference.    Significant Diagnostic Studies: US  Abdomen Limited Result Date: 06/09/2024 CLINICAL DATA:  [id number] Liver mass [id number]. New diagnosis of spiculated RIGHT lung mass suspicious for malignancy, and hypodense hepatic lesions. Question hepatic metastases. EXAM: ULTRASOUND ABDOMEN LIMITED COMPARISON:  CTA chest, 06/06/2024.  CT AP, 06/07/2024. FINDINGS: Focused ultrasound at the along the LEFT and RIGHT hepatic lobes correlating with hypodense masses seen on comparison CT. Demonstrated within the LEFT and RIGHT hepatic lobes are well-circumscribed anechoic and avascular hepatic fluid collections measuring up to 1.7 and 1.0 cm respectively. No solid or additional suspicious hepatic lesion is identified. IMPRESSION: LEFT and RIGHT hepatic lobe hypodensities on comparison CT are consistent with simple-appearing hepatic cysts. Liver mass biopsy was NOT  performed. Thom Hall, MD [facility] Electronically Signed   By: Thom Hall M.D.   On: 06/09/2024 08:44   CT HEAD W & WO CONTRAST ( ) Result Date: 06/08/2024 CLINICAL DATA:  Initial evaluation for metastatic disease. EXAM: CT HEAD WITHOUT AND WITH CONTRAST TECHNIQUE: Contiguous axial images were obtained from the base of the skull through the vertex without and with intravenous contrast. RADIATION DOSE REDUCTION: This exam was performed according to the departmental dose-optimization program which includes automated exposure control, adjustment of the mA and/or kV according to patient size and/or use of iterative reconstruction technique. CONTRAST:  75mL OMNIPAQUE  IOHEXOL  350 MG/ML SOLN COMPARISON:  CT from 07/16/2020. FINDINGS: Brain: Generalized age-related cerebral atrophy. Patchy and confluent hypodensity involving the supratentorial cerebral white matter, consistent with chronic small vessel ischemic disease, advanced in nature. 1 cm hypodensity at the anterior limb of the left internal capsule (series 9, image 18), age indeterminate. No other acute large vessel territory infarct. No acute intracranial hemorrhage. No mass lesion, mass effect, or midline shift. No abnormal enhancement seen following contrast administration. No evidence for intracranial metastatic disease. No hydrocephalus or extra-axial fluid collection. Vascular: No abnormal hyperdense vessel seen prior to contrast administration. Calcified atherosclerosis present at skull base. Following contrast administration shin, normal intravascular enhancement seen throughout the major intracranial vascular structures. Skull: Scalp soft tissues demonstrate no acute finding. Calvarium intact. No focal osseous lesions. Sinuses/Orbits: Globes and orbital soft tissues within normal limits. Ocular senescent calcifications noted. Paranasal sinuses are largely clear. No significant mastoid  effusion. Other: None. IMPRESSION: 1. No evidence for intracranial metastatic disease. 2. 1 cm hypodensity at the anterior limb of the left internal capsule, age indeterminate. While this finding could be chronic in nature, a possible small acute to subacute ischemic infarct could also potentially have this appearance. Correlation with dedicated MRI could be performed for further evaluation as warranted. 3. Underlying age-related cerebral atrophy with advanced chronic small vessel ischemic disease. Electronically Signed   By: Morene Hoard M.D.   On: 06/08/2024 03:37   CT ABDOMEN PELVIS WO CONTRAST Result Date: 06/07/2024 EXAM: CT ABDOMEN AND PELVIS WITH CONTRAST 06/07/2024 06:11:38 PM TECHNIQUE: CT of the abdomen and pelvis was performed with the administration of intravenous contrast. Multiplanar reformatted images are provided for review. Automated exposure control, iterative reconstruction, and/or weight-based adjustment of the mA/kV was utilized to reduce the radiation dose to as low as reasonably achievable. COMPARISON: CT chest 06/06/2024 and report from abdominal ultrasound dated 03/06/2016 (images not available). CLINICAL HISTORY: Cancer staging. Thoracic adenopathy with right upper lobe irregular nodule suspicious for malignancy, for further staging assessment. * Tracking Code: BO *  FINDINGS: LOWER CHEST: Descending thoracic aortic atherosclerosis. Right coronary artery atherosclerosis. Emphysema at the lung bases. LIVER: 1.6 x 1.3 cm hypodense lesion in the lateral segment left hepatic lobe on image 14 series 2, internal density of 7.3 compatible with a cyst; also a 1.6 cm simple cyst was described in the left hepatic lobe on the ultrasound of 03/06/2016. The appearance was more ambiguous on the contrast enhanced CT chest from 06/06/2024 with a somewhat heterogeneous appearance, but this was probably due to motion artifact. A similar 1.1 cm lesion in the right hepatic lobe on image 17 of series  12 has a noncontrast CT density of 5 Hounsfield units on image 55 series 6, favoring a benign cyst. GALLBLADDER AND BILE DUCTS: Gallbladder is unremarkable. No biliary ductal dilatation. SPLEEN: No acute abnormality. PANCREAS: No acute abnormality. ADRENAL GLANDS: No acute abnormality. KIDNEYS, URETERS AND BLADDER: 7.6 x 5.6 cm homogeneous fluid density lesion of the left kidney upper pole, favoring benign cyst and not requiring further workup. Nonspecific 0.9 cm hypodense lesion of the right mid to lower kidney medially on image 30 series 2, nonspecific. High density throughout the urinary bladder compatible with excreted contrast medium. No stones in the kidneys or ureters. No hydronephrosis. No perinephric or periureteral stranding. GI AND BOWEL: Stomach demonstrates no acute abnormality. Suspected distal jejunal diverticulum on image 26 series 2 with internal air fluid level. Sigmoid colon diverticulosis. There is no bowel obstruction. PERITONEUM AND RETROPERITONEUM: No ascites. No free air. VASCULATURE: Aorta is normal in caliber. Atheromatous plaque at the origins of the celiac trunk and superior mesenteric artery. LYMPH NODES: No lymphadenopathy. REPRODUCTIVE ORGANS: No acute abnormality. BONES AND SOFT TISSUES: Sacralized L5 vertebral body. Mild grade 1 degenerative anterolisthesis at L4-L5 with L4 superior endplate concavity suggesting remote mild compression fracture. No focal soft tissue abnormality. IMPRESSION: 1. No findings of active malignancy in the abdomen/pelvis on today's noncontrast CT scan. 2. Mild grade 1 degenerative anterolisthesis at L4-5 with L4 superior endplate concavity suggesting remote mild compression fracture. 3. Descending thoracic aortic atherosclerosis. 4. Right coronary artery atherosclerosis. 5. Emphysema at the lung bases. 6. Sacralized L5 vertebral body. 7. Atheromatous plaque at the origins of the celiac trunk and superior mesenteric artery. 8. Sigmoid colon diverticulosis.  Electronically signed by: Ryan Salvage MD 06/07/2024 06:31 PM EST RP Workstation: [id number]   CT Angio Chest PE W and/or Wo Contrast Result Date: 06/06/2024 CLINICAL DATA:  Chest pain radiating to back for 2 weeks, hypoxia today EXAM: CT ANGIOGRAPHY CHEST WITH CONTRAST TECHNIQUE: Multidetector CT imaging of the chest was performed using the standard protocol during bolus administration of intravenous contrast. Multiplanar CT image reconstructions and MIPs were obtained to evaluate the vascular anatomy. RADIATION DOSE REDUCTION: This exam was performed according to the departmental dose-optimization program which includes automated exposure control, adjustment of the mA and/or kV according to patient size and/or use of iterative reconstruction technique. CONTRAST:  75mL OMNIPAQUE  IOHEXOL  350 MG/ML SOLN COMPARISON:  06/06/2024 FINDINGS: Cardiovascular: This is a technically adequate evaluation of the pulmonary vasculature. No filling defects or pulmonary emboli. The heart is unremarkable without pericardial effusion. No evidence of thoracic aortic aneurysm or dissection. Atherosclerosis of the aorta and coronary vasculature. Mediastinum/Nodes: There is mediastinal and right hilar adenopathy. Right paratracheal adenopathy measures up to 13 mm in short axis, reference image 24/302. Right hilar adenopathy measures up to 21 mm in short axis reference image 50/302. There is no left hilar adenopathy. Thyroid, trachea, and esophagus are unremarkable. Lungs/Pleura: Severe emphysema. There is  a spiculated pleural based right upper lobe mass measuring 2.1 x 1.2 cm reference image 47/303, highly concerning for malignancy given the associated mediastinal and right hilar adenopathy. No acute airspace disease, effusion, or pneumothorax. Central airways are patent. Upper Abdomen: Indeterminate 1.6 cm hypodensity within the ventral left lobe liver image 106/302, concerning for metastatic disease in light of intrathoracic  findings. Additional indeterminate hypodensity image 113/302 within the right lobe measuring 1.1 cm is more consistent with a hepatic cyst. There is an 8.0 x 5.9 x 5.5 cm soft tissue mass within the left suprarenal fossa, measuring up to 13 HU. It is difficult to tell whether this is related to the left adrenal gland or arises from the upper pole the left kidney. Dedicated abdominal CT with IV contrast may be useful. Musculoskeletal: No acute or destructive bony abnormalities. Reconstructed images demonstrate no additional findings. Review of the MIP images confirms the above findings. IMPRESSION: 1. No evidence of pulmonary embolus. 2. Spiculated subpleural right upper lobe mass measuring 2.1 x 1.2 cm, concerning for bronchogenic malignancy. PET scan may be useful. 3. Pathologic right paratracheal and right hilar adenopathy, concerning for nodal metastases. 4. Indeterminate left lobe liver hypodensity, also concerning for metastatic disease. 5. Soft tissue mass in the left suprarenal fossa, which may be of adrenal or renal origin. Further imaging with dedicated abdominal and pelvic CT with IV and oral contrast may be useful. 6. Aortic Atherosclerosis (ICD10-I70.0) and Emphysema (ICD10-J43.9). Electronically Signed   By: Ozell Daring M.D.   On: 06/06/2024 15:25   DG Chest 2 View Result Date: 06/06/2024 EXAM: 2 VIEW(S) XRAY OF THE CHEST 06/06/2024 10:23:00 AM COMPARISON: None available. CLINICAL HISTORY: chest /back pain FINDINGS: LUNGS AND PLEURA: Hyperinflation consistent with COPD. Upper lobe linear scarring. No pleural effusion. No pneumothorax. HEART AND MEDIASTINUM: Aortic arch calcifications. BONES AND SOFT TISSUES: No acute osseous abnormality. Osteopenia. Multilevel degenerative changes of the spine. IMPRESSION: 1. Emphysema. No acute cardiopulmonary abnormality. Electronically signed by: Rogelia Myers MD MD 06/06/2024 12:21 PM EST RP Workstation: [id number]    Microbiology: Recent Results (from  the past 240 hours)  MRSA Next Gen by PCR, Nasal     Status: None   Collection Time: 06/07/24  2:12 PM   Specimen: Nasal Mucosa; Nasal Swab  Result Value Ref Range Status   MRSA by PCR Next Gen NOT DETECTED NOT DETECTED Final    Comment: (NOTE) The GeneXpert MRSA Assay (FDA approved for NASAL specimens only), is one component of a comprehensive MRSA colonization surveillance program. It is not intended to diagnose MRSA infection nor to guide or monitor treatment for MRSA infections. Test performance is not FDA approved in patients less than 42 years old. Performed at [facility], [address] [place], [place] [address]   Resp panel by RT-PCR (RSV, Flu A&B, Covid) Anterior Nasal Swab     Status: None   Collection Time: 06/07/24  5:15 PM   Specimen: Anterior Nasal Swab  Result Value Ref Range Status   SARS Coronavirus 2 by RT PCR NEGATIVE NEGATIVE Final   Influenza A by PCR NEGATIVE NEGATIVE Final   Influenza B by PCR NEGATIVE NEGATIVE Final    Comment: (NOTE) The Xpert Xpress SARS-CoV-2/FLU/RSV plus assay is intended as an aid in the diagnosis of influenza from Nasopharyngeal swab specimens and should not be used as a sole basis for treatment. Nasal washings and aspirates are unacceptable for Xpert Xpress SARS-CoV-2/FLU/RSV testing.  Fact Sheet for Patients: bloggercourse.com  Fact Sheet for  Healthcare Providers: seriousbroker.it  This test is not yet approved or cleared by the [place]  FDA and has been authorized for detection and/or diagnosis of SARS-CoV-2 by FDA under an Emergency Use Authorization (EUA). This EUA will remain in effect (meaning this test can be used) for the duration of the COVID-19 declaration under Section 564(b)(1) of the Act, 21 U.S.C. section 360bbb-3(b)(1), unless the authorization is terminated or revoked.     Resp Syncytial Virus by PCR NEGATIVE NEGATIVE Final    Comment:  (NOTE) Fact Sheet for Patients: bloggercourse.com  Fact Sheet for Healthcare Providers: seriousbroker.it  This test is not yet approved or cleared by the [place]  FDA and has been authorized for detection and/or diagnosis of SARS-CoV-2 by FDA under an Emergency Use Authorization (EUA). This EUA will remain in effect (meaning this test can be used) for the duration of the COVID-19 declaration under Section 564(b)(1) of the Act, 21 U.S.C. section 360bbb-3(b)(1), unless the authorization is terminated or revoked.  Performed at [facility], [address], [place], [place] [address]      Labs: Basic Metabolic Panel: Recent Labs  Lab 06/06/24 1140 06/07/24 0731 06/08/24 0241  NA 132* 134* 132*  K 4.3 4.7 4.3  CL 97* 97* 97*  CO2 24 24 24   GLUCOSE 110* 101* 144*  BUN 9 12 11   CREATININE 0.80 0.62 0.59  CALCIUM 10.2 10.5* 9.9   Liver Function Tests: No results for input(s): AST, ALT, ALKPHOS, BILITOT, PROT, ALBUMIN in the last 168 hours. No results for input(s): LIPASE, AMYLASE in the last 168 hours. No results for input(s): AMMONIA in the last 168 hours. CBC: Recent Labs  Lab 06/06/24 0953 06/07/24 0731 06/08/24 0241  WBC 9.4 10.7* 9.5  NEUTROABS  --  8.6*  --   HGB 13.7 13.5 12.8  HCT 39.9 39.5 37.0  MCV 93.9 94.0 94.4  PLT 458* 485* 474*   Cardiac Enzymes: No results for input(s): CKTOTAL, CKMB, CKMBINDEX, TROPONINI in the last 168 hours. BNP: BNP (last 3 results) No results for input(s): BNP in the last 8760 hours.  ProBNP (last 3 results) No results for input(s): PROBNP in the last 8760 hours.  CBG: No results for input(s): GLUCAP in the last 168 hours.     Signed:  Sigurd Pac MD.  [facility] 06/10/2024, 9:08 AM        [1]  Allergies Allergen Reactions   Penicillins Hives, Rash and Swelling   Procaine Other (See Comments)    Mouth  decay     Statins Other (See Comments)    Muscle aches Muscle aches

## 2024-06-10 NOTE — TOC Transition Note (Signed)
 Transition of Care [facility]) - Discharge Note   Patient Details  Name: Cynthia Walton MRN: [id number] Date of Birth: 27-Jun-1940  [facility] CM/SW Contact:  Corean JAYSON Canary, RN Phone Number: 06/10/2024, 9:25 AM   Clinical Narrative:     Patient qualified for oxygen, she has [employer], called [employer]  and placed in HUB for delivery of oxygen at 2L The patient is being discharged today, no further needs identified  Final next level of care: Home/Self Care Barriers to Discharge: No Barriers Identified   Patient Goals and CMS Choice Patient states their goals for this hospitalization and ongoing recovery are:: Return home CMS Medicare.gov Compare Post Acute Care list provided to:: Patient Choice offered to / list presented to : Patient      Discharge Placement                       Discharge Plan and Services Additional resources added to the After Visit Summary for   In-house Referral: Hospice / Palliative Care Discharge Planning Services: CM Consult            DME Arranged: Oxygen DME Agency: [employer] Date DME Agency Contacted: 06/10/24 Time DME Agency Contacted: [phone] Representative spoke with at DME Agency: Darlyn CHEADLE Agency: [facility] Date [facility] Agency Contacted: 06/09/24 Time HH Agency Contacted: [phone] Representative spoke with at [facility] Agency: Melissa  Social Drivers of Health (SDOH) Interventions SDOH Screenings   Food Insecurity: No Food Insecurity (06/07/2024)  Housing: Low Risk (06/07/2024)  Transportation Needs: No Transportation Needs (06/07/2024)  Utilities: Not At Risk (06/07/2024)  Social Connections: Moderately Integrated (06/09/2024)  Tobacco Use: Medium Risk (06/06/2024)     Readmission Risk Interventions     No data to display

## 2024-06-22 ENCOUNTER — Inpatient Hospital Stay (HOSPITAL_BASED_OUTPATIENT_CLINIC_OR_DEPARTMENT_OTHER)
